# Patient Record
Sex: Male | Born: 1960
Health system: Southern US, Community
[De-identification: ages and names within clinical notes are randomized; demographics above are authoritative.]

## PROBLEM LIST (undated history)

## (undated) DIAGNOSIS — M1990 Unspecified osteoarthritis, unspecified site: Secondary | ICD-10-CM

## (undated) DIAGNOSIS — R7303 Prediabetes: Secondary | ICD-10-CM

## (undated) DIAGNOSIS — I471 Supraventricular tachycardia, unspecified: Secondary | ICD-10-CM

## (undated) DIAGNOSIS — B269 Mumps without complication: Secondary | ICD-10-CM

## (undated) DIAGNOSIS — R002 Palpitations: Secondary | ICD-10-CM

## (undated) DIAGNOSIS — I10 Essential (primary) hypertension: Secondary | ICD-10-CM

## (undated) DIAGNOSIS — E291 Testicular hypofunction: Secondary | ICD-10-CM

## (undated) DIAGNOSIS — K602 Anal fissure, unspecified: Secondary | ICD-10-CM

## (undated) DIAGNOSIS — F5104 Psychophysiologic insomnia: Secondary | ICD-10-CM

## (undated) DIAGNOSIS — N400 Enlarged prostate without lower urinary tract symptoms: Secondary | ICD-10-CM

## (undated) DIAGNOSIS — B059 Measles without complication: Secondary | ICD-10-CM

## (undated) DIAGNOSIS — N529 Male erectile dysfunction, unspecified: Secondary | ICD-10-CM

## (undated) DIAGNOSIS — E785 Hyperlipidemia, unspecified: Secondary | ICD-10-CM

## (undated) DIAGNOSIS — J329 Chronic sinusitis, unspecified: Secondary | ICD-10-CM

## (undated) HISTORY — DX: Supraventricular tachycardia: I47.1

## (undated) HISTORY — DX: Mumps without complication: B26.9

## (undated) HISTORY — DX: Palpitations: R00.2

## (undated) HISTORY — DX: Measles without complication: B05.9

## (undated) HISTORY — DX: Essential (primary) hypertension: I10

## (undated) HISTORY — DX: Supraventricular tachycardia, unspecified: I47.10

## (undated) HISTORY — DX: Psychophysiologic insomnia: F51.04

## (undated) HISTORY — DX: Testicular hypofunction: E29.1

## (undated) HISTORY — DX: Hyperlipidemia, unspecified: E78.5

## (undated) HISTORY — DX: Male erectile dysfunction, unspecified: N52.9

## (undated) HISTORY — DX: Anal fissure, unspecified: K60.2

## (undated) HISTORY — DX: Benign prostatic hyperplasia without lower urinary tract symptoms: N40.0

## (undated) HISTORY — DX: Chronic sinusitis, unspecified: J32.9

---

## 1988-01-06 HISTORY — PX: HERNIA REPAIR: SHX51

## 1990-01-05 HISTORY — PX: VASECTOMY: SHX75

## 2003-12-03 ENCOUNTER — Other Ambulatory Visit: Payer: Self-pay

## 2003-12-03 ENCOUNTER — Emergency Department: Payer: Self-pay | Admitting: Internal Medicine

## 2005-05-05 ENCOUNTER — Emergency Department: Payer: Self-pay | Admitting: Emergency Medicine

## 2005-05-05 ENCOUNTER — Other Ambulatory Visit: Payer: Self-pay

## 2005-05-14 ENCOUNTER — Ambulatory Visit: Payer: Self-pay | Admitting: Internal Medicine

## 2005-06-05 ENCOUNTER — Ambulatory Visit: Payer: Self-pay | Admitting: Internal Medicine

## 2005-06-12 ENCOUNTER — Observation Stay (HOSPITAL_COMMUNITY): Admission: RE | Admit: 2005-06-12 | Discharge: 2005-06-13 | Payer: Self-pay | Admitting: Internal Medicine

## 2005-06-12 ENCOUNTER — Ambulatory Visit: Payer: Self-pay | Admitting: Internal Medicine

## 2005-07-09 ENCOUNTER — Ambulatory Visit: Payer: Self-pay | Admitting: Internal Medicine

## 2006-04-07 ENCOUNTER — Ambulatory Visit: Payer: Self-pay | Admitting: Gastroenterology

## 2007-07-18 ENCOUNTER — Ambulatory Visit: Payer: Self-pay | Admitting: Internal Medicine

## 2008-01-06 HISTORY — PX: OTHER SURGICAL HISTORY: SHX169

## 2008-06-26 DIAGNOSIS — I471 Supraventricular tachycardia, unspecified: Secondary | ICD-10-CM | POA: Insufficient documentation

## 2008-06-26 DIAGNOSIS — R42 Dizziness and giddiness: Secondary | ICD-10-CM | POA: Insufficient documentation

## 2008-07-05 ENCOUNTER — Ambulatory Visit: Payer: Self-pay | Admitting: Internal Medicine

## 2009-08-26 ENCOUNTER — Ambulatory Visit: Payer: Self-pay | Admitting: Gastroenterology

## 2010-05-20 NOTE — Assessment & Plan Note (Signed)
[facility]                         ELECTROPHYSIOLOGY OFFICE NOTE   NAME:[name], [name]                           MRN:          [id number]  DATE:07/18/2007                            DOB:          May 04, 1960    [name] returns today for followup.  He is a very pleasant middle-aged  male with a history of SVT and concealed WPW syndrome who had a  mid/anteroseptal accessory pathway for which she underwent catheter  ablation back in June 2007.  At that time, catheter manipulation  resulted in rendering the accessory pathway nonfunctional.  Ablation was  subsequently delivered to multiple sites very carefully in the mid  septal space.  Of note, the patient care was made secondary concerns  about resulting in complete heart block which, in fact, did not  accomplish.  The patient did well with very little in the way of  symptoms for approximately 2 years.  Several weeks ago, the patient had  three episodes of tachy palpitations to which he was able to self  terminate, the other however, which persisted and resulted in medical  attention.  He was subsequently found by his report to be in SVT at 220  beats per minute and was treated with intravenous adenosine restoring  sinus rhythm.  At that point, he was begun on beta-blocker therapy.  The  patient has had no recurrent symptoms since starting beta-blockers.  He  had no specific complaints otherwise today.   MEDICATIONS:  1. Ambien p.r.n.  2. AndroGel.  3. Metoprolol succinate 25 mg daily.   PHYSICAL EXAMINATION:  GENERAL:  He is a pleasant well-appearing middle-  aged man in no distress.  VITAL SIGNS:  Blood pressure was 130/93, the pulse 90 and regular, and  respirations were 18.  Weight was 202 pounds.  NECK:  No jugular venous distention.  LUNGS:  Clear bilaterally to auscultation.  No wheezes, rales, or  rhonchi are present.  CARDIOVASCULAR:  Regular rate and rhythm with normal S1 and S2.   ABDOMEN:  Soft, nontender, and nondistended.  There is no organomegaly.  EXTREMITIES:  No cyanosis, clubbing, or edema.  The pulses are 2+ and  symmetric.   IMPRESSION:  1. Recurrent supraventricular tachycardia after ablation for which the      accessory pathway was inadvertently bumped at the time of initial      ablation making the tachycardia much more difficult to map.  I      discussed treatment options with [name].  I have recommended that      he continue on with his beta-blockers for now.  If his      supraventricular tachycardia returns, then consideration for redo      ablation would certainly be in order though there is a risk of      heart block.  Based on the location of his accessory pathway.  I      will see the patient back in the      office in several months.  Doylene Christopher Rose. Ladona Ridgel, MD  Electronically Signed  GWT/MedQ  DD: 07/18/2007  DT: 07/19/2007  Job #: [id number]   cc:   Julieanne Manson

## 2010-05-23 NOTE — Op Note (Signed)
NAMECLAYBURN, Christopher Rose                  ACCOUNT NO.:  [id number]   MEDICAL RECORD NO.:  [id number]          PATIENT TYPE:  INP   LOCATION:  2807                         FACILITY:  [facility]   PHYSICIAN:  Doylene Canning. Ladona Ridgel, M.D.  DATE OF BIRTH:  07-26-1960   DATE OF PROCEDURE:  06/12/2005  DATE OF DISCHARGE:                                 OPERATIVE REPORT   PROCEDURE PERFORMED:  Electrophysiology study and catheter ablation of a  concealed mid septal accessory pathway.   INTRODUCTION:  The patient is a very pleasant middle-aged male with a  history of recurrent tachy palpitations and documented SVT with heart rates  of over 220 beats terminated with adenosine in the past.  The patient's  spells start and stop suddenly and he has been refractory to medical therapy  and is now referred for electrophysiologic study and catheter ablation.   PROCEDURE:  After informed consent was obtained, the patient was taken to  the diagnostic EP lab in a fasting state.  After the usual preparation and  draping, intravenous fentanyl and midazolam were given for sedation.  A 6-  [place] hexapolar catheter was inserted percutaneously in the right jugular  vein and advanced to the coronary sinus.  A 5-French quadripolar catheter  was inserted percutaneously in the right femoral vein and advanced to the RV  apex.  A 5-French quadripolar catheter was inserted percutaneously in the  right femoral vein and advanced to the His bundle region.  After measuring  the basic intervals, rapid ventricular pacing was carried out from the RV  apex and stepwise decreased down to 280 milliseconds where VA Wenckebach was  observed.  During rapid ventricular pacing, the atrial activation appeared  to be midline, although there was a question of whether the CS proximal  activation was earlier than the His activation.  There is no inducible  tachycardia.  The atrial activation did decrement but only at the very end  of pacing.  Next,  programmed ventricular stimulation was carried out from  the RV apex at base drive cycle length of 403 milliseconds and the S1-S2  interval was stepwise decreased down to 220 milliseconds where the  ventricular refractoriness was observed.  During programmed ventricular  stimulation, the atrial activation was again minimally decremental and the  activation appeared be earliest in the septum, although the His A and the CS  proximal catheter were fairly close to being on time.  Next, programmed  atrial stimulation was carried out from the coronary sinus at a base drive  cycle length of 474 milliseconds and the S1-S2 interval was stepwise  decreased down to 270 milliseconds where the AV node ERP was observed.  During probing atrial stimulation, there were AH jumps, echo beats, double  echo beats, and nonsustained SVT.  Then, rapid atrial pacing was carried out  from the high right atrium as well as the coronary sinus at a base drive  cycle length of 259 milliseconds and stepwise decreased down to 290  milliseconds where AV Wenckebach was observed.  During rapid atrial pacing,  the PR interval was  equal to the RR interval and at times greater than the  RR interval and there was nonsustained SVT.  It should be noted that the [place]  time was slightly increased from would what normally be expected for AV node  reentry and the patient had inducible tachycardia throughout the atrial  pacing which was nonsustained.  Next, Isoproterenol was infused at a rate of  1 mcg per minute and additional rapid atrial pacing, programmed atrial  stimulation, rapid ventricular pacing and programmed ventricular stimulation  was carried out.  During rapid atrial pacing, there was inducible SVT at a  rate of 250 beats per minute.  This was characterized by a short VA time  although the [place] time was longer than we would normally expect with AV node  reentry.  PVCs were placed at the time of His bundle refractoriness and  this  demonstrated atrial pre-excitation which was quite minimal but still present  typically the atrial being pre-excited approximately 5-8 milliseconds with  PVCs.  With all the above, the diagnosis of a septal accessory pathway  (concealed) was demonstrated and mapping was carried out.  Initially,  mapping was carried on the posteroseptal space near the CS os.  However,  there were no early times in this location.  Additional mapping was carried  out in the mid septal space where there was progressively earlier atrial  activation during tachycardia.  Finally, mapping was carried out in the  anterior septal space and in the area above the His bundle region, there was  a later [place] time suggesting that this was not a true anteroseptal accessory  pathway.  Both the ventricular and the atrial insertion of the pathway was  mapped but it should be noted that mapping was difficult as this was a  retrograde only pathway.  Mapping was also made more difficult by the fact  the tachycardia was quite rapid and associated with some hemodynamic  difficulties for the patient resulting in such that we had to terminate the  tachycardia frequently.  A total of 13 RF energy applications were  delivered.  During the left RF energy application, mapping was carried out  just prior to this.  Prior to 11th RF energy application, tachycardia was  easily inducible.  Following the 11th RF energy application and following  two bonus RF energy applications, the patient was observed for approximately  40 minutes and had no recurrent inducible SVT.  It should be noted, however,  that during the mapping portion of the procedure we could not exclude that  we had inadvertently bumped the tachycardia and not permanently destroyed  it.  However, after 40 minutes there was no inducible tachycardia and as  such, the catheters were removed, hemostasis was assured, and the patient was returned to his room in satisfactory  condition.   COMPLICATIONS:  There were no immediate procedure complications.   RESULTS:  A.  Baseline ECG.  The baseline ECG demonstrates normal sinus  rhythm with normal axis intervals.  B.  The baseline intervals the sinus node cycle length was 830 milliseconds,  the PR interval 128 milliseconds, the HV interval 49 milliseconds, QRS  duration was 84 milliseconds.  C.  Rapid ventricular pacing.  Rapid ventricular pacing was carried out from  the RV apex and stepwise decreased down to 280 milliseconds where VA  Wenckebach was observed.  During rapid ventricular pacing, the atrial  activation was minimally decremental until a pacing cycle length of  approximately 300 milliseconds.  D.  Programmed ventricular stimulation.  Programmed ventricular stimulation  was carried out from the RV apex at base drive cycle length of 161  milliseconds.  The S1-S2 interval was stepwise decreased down to 220  milliseconds where the ventricular refractoriness was observed.  During  programmed ventricular stimulation, the atrial activation was essentially  midline but very minimally decremental until an S1-S2 coupling interval of  approximately 500 to 80.  E.  Rapid atrial pacing.  Rapid atrial pacing was carried out from the  coronary sinus and the high right atrium at a base drive cycle length of 096  milliseconds and stepwise decreased down to 290 milliseconds where AV  Wenckebach was observed.  During rapid atrial pacing, the PR interval was  equal to the RR interval and there was inducible nonsustained SVT.  Following Isoproterenol, rapid atrial pacing resulted in inducible SVT.  F.  Programmed atrial stimulation.  Programmed atrial stimulation was  carried out from the high right atrium and the coronary sinus at a base  drive cycle length of 045 milliseconds.  The S1-S2 interval was stepwise  decreased down to 270 milliseconds where the AV node ERP was observed.  During programmed atrial  stimulation, there were multiple echo beats and  inducible SVT.  G.  Arrhythmias observed:  1.  AV reentry tachycardia.  Initiation rapid atrial pacing, probing      stimulation, on isoproterenol, duration was sustained, termination was      with rapid ventricular pacing.      1.  Mapping.  Mapping of the tachycardia demonstrated earliest atrial          activation in the mid septal space very close to the AV node.          1.  RF energy application.  A total of 13 RF energy applications              were delivered first to the posteroseptal space and then to the              anteroseptal space rendering the tachycardia not inducible.  We              could not definitively exclude that we had not just bumped the              pathway as we could not do ablation during tachycardia for              concerns of injuring the AV node.   CONCLUSION:  This study demonstrates apparent successful EP study and RF catheter ablation of a concealed mid septal accessory pathway originating  very close to the AV node region.  13 RF energy applications were delivered  and following this there was no inducible SVT.           ______________________________  Doylene Canning. Ladona Ridgel, M.D.     GWT/MEDQ  D:  06/12/2005  T:  06/12/2005  Job:  [id number]   cc:   Julieanne Manson  Fax: [phone]

## 2010-05-23 NOTE — Discharge Summary (Signed)
Christopher Rose, Christopher Rose                  ACCOUNT NO.:  [id number]   MEDICAL RECORD NO.:  [id number]          PATIENT TYPE:  INP   LOCATION:  6525                         FACILITY:  [facility]   PHYSICIAN:  Doylene Canning. Ladona Ridgel, M.D.  DATE OF BIRTH:  [birth year]   DATE OF ADMISSION:  06/12/2005  DATE OF DISCHARGE:  06/13/2005                                 DISCHARGE SUMMARY   ALLERGIES:  AMOXICILLIN.   PRINCIPAL DIAGNOSIS:  1.  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  2.  Symptomatic tachyarrhythmias.      1.  Dizzy/lightheaded.      2.  Recurrent episodes despite medical therapy (calcium channel          blockers).      3.  Prolonged episodes requiring adenosine challenge to convert to sinus          rhythm.   SECONDARY DIAGNOSIS:  No significant medical history.   PROCEDURE:  June 12, 2005, electrophysiology/radiofrequency catheter ablation  of inducible atrioventricular reentry tachycardia.  The patient had a  concealed accessory pathway.  Doylene Canning. Ladona Ridgel, M.D., practitioner.  The  patient has had no postprocedural complications.  The catheterization sites  are healing nicely without hematoma.  The patient is alert and oriented.  He  is in sinus rhythm.  He is asked not to drive for the next 2 days, not to  lift anything heavier than 10 pounds for the next 2 weeks.  He may shower.   DISCHARGE MEDICATIONS:  1.  Cardizem LA 360 mg daily.  2.  Ambien 10 mg daily.  3.  Enteric-coated aspirin 81 mg daily for at least 6 weeks.  4.  Note that if he has dental work, even just teeth cleaning, through      November 2007, he is to call [facility] at [phone] for      antibiotic coverage.   He has a follow-up with Dr. Ladona Ridgel at [facility], [address] Thursday, July 5, at 10:15.   BRIEF HISTORY:   [name] is a 50 year old male.  He has a history of  recurrent supraventricular tachycardias.  He has had these for several  years.  They are increasing in frequency and severity.  He has been on  calcium channel blockers but continues to have recurrent episodes.  With  these episodes he will get dizzy and lightheaded.  He does not have frank  syncope.  They are prolonged and often require emergency room challenge with  adenosine to restore sinus rhythm.  He does have mild chest pressure when he  is tachycardic.  He was referred to Dr. Lewayne Bunting, who saw him on May 10,  and the risks, benefits, goals and expectations of electrophysiology study  with catheter ablation have been described to the patient, who wishes to  schedule this electively.   HOSPITAL COURSE:  The patient  presented electively June 8.  He  had  inducible AV reentry tachycardia by way of a concealed accessory pathway.  A  radiofrequency catheter ablation was delivered.  The pathway is close to the  AV node.  The patient had an apparent successful ablation.  He discharges  with his medications and follow-up as noted.      Maple Mirza, P.A.    ______________________________  Doylene Canning. Ladona Ridgel, M.D.    GM/MEDQ  D:  06/12/2005  T:  06/13/2005  Job:  [id number]   cc:   Julieanne Manson  Fax: [phone]

## 2012-08-14 ENCOUNTER — Emergency Department: Payer: Self-pay | Admitting: Unknown Physician Specialty

## 2014-07-17 ENCOUNTER — Other Ambulatory Visit: Payer: BLUE CROSS/BLUE SHIELD

## 2014-07-17 DIAGNOSIS — E291 Testicular hypofunction: Secondary | ICD-10-CM

## 2014-07-18 ENCOUNTER — Encounter: Payer: Self-pay | Admitting: *Deleted

## 2014-07-18 LAB — TESTOSTERONE: Testosterone: 337 ng/dL — ABNORMAL LOW (ref 348–1197)

## 2014-07-24 ENCOUNTER — Other Ambulatory Visit: Payer: Self-pay | Admitting: *Deleted

## 2014-07-24 ENCOUNTER — Encounter: Payer: Self-pay | Admitting: Urology

## 2014-07-24 ENCOUNTER — Ambulatory Visit (INDEPENDENT_AMBULATORY_CARE_PROVIDER_SITE_OTHER): Payer: BLUE CROSS/BLUE SHIELD | Admitting: Urology

## 2014-07-24 VITALS — BP 134/83 | HR 76 | Ht 69.0 in | Wt 194.6 lb

## 2014-07-24 DIAGNOSIS — E291 Testicular hypofunction: Secondary | ICD-10-CM

## 2014-07-24 MED ORDER — TESTOSTERONE 75 MG IL PLLT
75.0000 mg | PELLET | Freq: Once | Status: AC
  Administered 2014-07-24: 75 mg

## 2014-07-24 NOTE — Progress Notes (Signed)
This is a 54 -year-old male with hypogonadism and he is managed with Testopel. He presents today for Testopel insertion.  Patient is placed on the exam table in the left lateral jackknife position.  Identified upper outer quadrant of hip for insertion; prepped area with Betadine and injected 10 cc's of Lidocaine 1% with Epinephrine to anesthetize superficially and distally along trocar tract.  Made 3 mm incision using 15 blade of scalpel; trocar with sharp ended stylet was inserted into subcutaneous tissue in line with femur. Sharp stylet was withdrawn and 6 pellets were placed into trocar well. Testopel pellets advanced into tissue using blunt ended stylet. Trocar removed and incision closed using 3 Steri-Strips. Cleansed area to remove Betadine and covered Steri-Strips with outer Band-Aid.  Careful inspection of insertion is done and patient informed of post procedure instructions.  He will return in three month for serum testosterone before 9:00am, HCT, PSA and DRE.

## 2014-08-30 ENCOUNTER — Other Ambulatory Visit: Payer: Self-pay | Admitting: Family Medicine

## 2014-09-09 ENCOUNTER — Other Ambulatory Visit: Payer: Self-pay | Admitting: Family Medicine

## 2014-09-20 ENCOUNTER — Ambulatory Visit (INDEPENDENT_AMBULATORY_CARE_PROVIDER_SITE_OTHER): Payer: BLUE CROSS/BLUE SHIELD | Admitting: Family Medicine

## 2014-09-20 ENCOUNTER — Encounter: Payer: Self-pay | Admitting: Family Medicine

## 2014-09-20 VITALS — BP 138/72 | HR 60 | Temp 98.0°F | Resp 12 | Ht 69.0 in | Wt 194.0 lb

## 2014-09-20 DIAGNOSIS — N528 Other male erectile dysfunction: Secondary | ICD-10-CM | POA: Diagnosis not present

## 2014-09-20 DIAGNOSIS — Z0000 Encounter for general adult medical examination without abnormal findings: Secondary | ICD-10-CM | POA: Diagnosis not present

## 2014-09-20 DIAGNOSIS — E291 Testicular hypofunction: Secondary | ICD-10-CM | POA: Insufficient documentation

## 2014-09-20 DIAGNOSIS — I10 Essential (primary) hypertension: Secondary | ICD-10-CM | POA: Insufficient documentation

## 2014-09-20 DIAGNOSIS — Z87898 Personal history of other specified conditions: Secondary | ICD-10-CM | POA: Insufficient documentation

## 2014-09-20 DIAGNOSIS — G4700 Insomnia, unspecified: Secondary | ICD-10-CM | POA: Insufficient documentation

## 2014-09-20 DIAGNOSIS — N529 Male erectile dysfunction, unspecified: Secondary | ICD-10-CM | POA: Insufficient documentation

## 2014-09-20 DIAGNOSIS — K648 Other hemorrhoids: Secondary | ICD-10-CM | POA: Insufficient documentation

## 2014-09-20 DIAGNOSIS — E7800 Pure hypercholesterolemia, unspecified: Secondary | ICD-10-CM | POA: Insufficient documentation

## 2014-09-20 LAB — POCT URINALYSIS DIPSTICK
BILIRUBIN UA: NEGATIVE
Blood, UA: NEGATIVE
GLUCOSE UA: NEGATIVE
KETONES UA: NEGATIVE
LEUKOCYTES UA: NEGATIVE
Nitrite, UA: NEGATIVE
Protein, UA: NEGATIVE
Spec Grav, UA: 1.025
Urobilinogen, UA: NEGATIVE
pH, UA: 6.5

## 2014-09-20 MED ORDER — SILDENAFIL CITRATE 20 MG PO TABS: ORAL_TABLET | ORAL | Status: DC

## 2014-09-20 NOTE — Progress Notes (Signed)
Patient ID: Christopher Rose, male   DOB: 04/01/1960, 53 y.o.   MRN: [id number]  Visit Date: 09/20/2014  Today's Provider: Wilhemena Durie, MD   Chief Complaint  Patient presents with  . Annual Exam   Subjective:  Christopher Rose is a 54 y.o. male who presents today for health maintenance and complete physical. He feels well. He reports he is sleeping well.  LAST: Colonoscopy 2014 per patient with Dr. Dionne Milo but we do not have results. Will get those records.  Tdap 08/11/12  EKG 08/11/12.  Review of Systems  Constitutional: Positive for fatigue.  HENT: Negative.   Eyes: Negative.   Respiratory: Negative.   Cardiovascular: Negative.   Gastrointestinal: Negative.   Endocrine: Negative.   Genitourinary: Negative.   Musculoskeletal: Negative.   Skin: Negative.   Allergic/Immunologic: Negative.   Neurological: Negative.   Hematological: Negative.   Psychiatric/Behavioral: Negative.     Social History   Social History  . Marital Status: Married    Spouse Name: N/A  . Number of Children: N/A  . Years of Education: N/A   Occupational History  . Not on file.   Social History Main Topics  . Smoking status: Never Smoker   . Smokeless tobacco: Never Used  . Alcohol Use: 0.0 oz/week    0 Standard drinks or equivalent per week     Comment: occasional  . Drug Use: No  . Sexual Activity: Yes   Other Topics Concern  . Not on file   Social History Narrative    Patient Active Problem List   Diagnosis Date Noted  . Insomnia, persistent 09/20/2014  . ED (erectile dysfunction) of organic origin 09/20/2014  . History of prolonged Q-T interval on ECG 09/20/2014  . Internal hemorrhoids 09/20/2014  . Hypercholesteremia 09/20/2014  . BP (high blood pressure) 09/20/2014  . 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 SUPRAVENTRICULAR TACHYCARDIA 06/26/2008  . DIZZINESS 06/26/2008    Past Surgical History  Procedure Laterality Date  . Cardiac ablation surgery  2010  . Vasectomy  1992  . Hernia  repair  1990    His family history includes Colon cancer in his paternal grandfather; Diabetes Mellitus II in his mother; Heart attack in his father. There is no history of Kidney disease or Prostate cancer.    Outpatient Prescriptions Prior to Visit  Medication Sig Dispense Refill  . meloxicam (MOBIC) 15 MG tablet take 1 tablet by mouth once daily 30 tablet 12  . metoprolol succinate (TOPROL-XL) 25 MG 24 hr tablet take 1 tablet by mouth once daily 30 tablet 12  . tadalafil (CIALIS) 20 MG tablet Take 20 mg by mouth daily as needed for erectile dysfunction.    . Testosterone (TESTOPEL) 75 MG PLLT by Implant route every 3 (three) months.    . zolpidem (AMBIEN) 10 MG tablet take 1 tablet by mouth at bedtime if needed 30 tablet 5   No facility-administered medications prior to visit.    Patient Care Team: Jerrol Banana., MD as PCP - General (Family Medicine)     Objective:   Vitals:  Filed Vitals:   09/20/14 0914  BP: 138/72  Pulse: 60  Temp: 98 F (36.7 C)  Resp: 12  Height: 5\' 9"  (1.753 m)  Weight: 194 lb (87.998 kg)    Physical Exam  Constitutional: He is oriented to person, place, and time. He appears well-developed and well-nourished.  HENT:  Head: Normocephalic.  Eyes: Conjunctivae are normal. Pupils are equal, round, and reactive to light.  Neck: Normal range of motion. Neck supple.  Cardiovascular: Normal rate, regular rhythm, normal heart sounds and intact distal pulses.   No murmur heard. Pulmonary/Chest: Effort normal and breath sounds normal. No respiratory distress. He has no wheezes.  Abdominal: Soft. Bowel sounds are normal. There is no tenderness. There is no rebound.  Genitourinary: Rectum normal, prostate normal and penis normal.  Musculoskeletal: Normal range of motion. He exhibits no edema or tenderness.  Neurological: He is alert and oriented to person, place, and time.  Skin: Skin is warm and dry. No erythema.  Psychiatric: He has a normal mood  and affect. His behavior is normal. Judgment and thought content normal.     Depression Screen PHQ 2/9 Scores 09/20/2014  PHQ - 2 Score 0      Assessment & Plan:   1. Annual physical exam PSA is updated with urologist. - CBC with Differential/Platelet - Comprehensive Metabolic Panel (CMET) - TSH - Lipid Panel With LDL/HDL Ratio - POCT urinalysis dipstick Discussed a regular exercise program. Overall health is good. Patient may be retiring in the next year. 2. Other male erectile dysfunction Cialis is not been covered. Will try Sildanefil. Follow as needed       Patient was seen and examined by Dr. Eulas Post and note was scribed by Theressa Millard, RMA.

## 2014-09-21 LAB — COMPREHENSIVE METABOLIC PANEL
ALBUMIN: 4.2 g/dL (ref 3.5–5.5)
ALT: 26 IU/L (ref 0–44)
AST: 20 IU/L (ref 0–40)
Albumin/Globulin Ratio: 1.9 (ref 1.1–2.5)
Alkaline Phosphatase: 97 IU/L (ref 39–117)
BILIRUBIN TOTAL: 0.9 mg/dL (ref 0.0–1.2)
BUN/Creatinine Ratio: 15 (ref 9–20)
BUN: 16 mg/dL (ref 6–24)
CALCIUM: 9.4 mg/dL (ref 8.7–10.2)
CHLORIDE: 100 mmol/L (ref 97–108)
CO2: 24 mmol/L (ref 18–29)
CREATININE: 1.04 mg/dL (ref 0.76–1.27)
GFR, EST AFRICAN AMERICAN: 93 mL/min/{1.73_m2} (ref >59)
GFR, EST NON AFRICAN AMERICAN: 80 mL/min/{1.73_m2} (ref >59)
GLUCOSE: 111 mg/dL — AB (ref 65–99)
Globulin, Total: 2.2 g/dL (ref 1.5–4.5)
Potassium: 4.7 mmol/L (ref 3.5–5.2)
Sodium: 140 mmol/L (ref 134–144)
Total Protein: 6.4 g/dL (ref 6.0–8.5)

## 2014-09-21 LAB — LIPID PANEL WITH LDL/HDL RATIO
Cholesterol, Total: 187 mg/dL (ref 100–199)
HDL: 46 mg/dL (ref >39)
LDL Calculated: 108 mg/dL — ABNORMAL HIGH (ref 0–99)
LDL/HDL RATIO: 2.3 ratio (ref 0.0–3.6)
Triglycerides: 164 mg/dL — ABNORMAL HIGH (ref 0–149)
VLDL CHOLESTEROL CAL: 33 mg/dL (ref 5–40)

## 2014-09-21 LAB — CBC WITH DIFFERENTIAL/PLATELET
BASOS: 0 %
Basophils Absolute: 0 10*3/uL (ref 0.0–0.2)
EOS (ABSOLUTE): 0.1 10*3/uL (ref 0.0–0.4)
Eos: 2 %
HEMOGLOBIN: 15.6 g/dL (ref 12.6–17.7)
Hematocrit: 45.2 % (ref 37.5–51.0)
IMMATURE GRANS (ABS): 0 10*3/uL (ref 0.0–0.1)
Immature Granulocytes: 0 %
LYMPHS: 21 %
Lymphocytes Absolute: 1.5 10*3/uL (ref 0.7–3.1)
MCH: 30.7 pg (ref 26.6–33.0)
MCHC: 34.5 g/dL (ref 31.5–35.7)
MCV: 89 fL (ref 79–97)
Monocytes Absolute: 0.6 10*3/uL (ref 0.1–0.9)
Monocytes: 8 %
NEUTROS ABS: 5.1 10*3/uL (ref 1.4–7.0)
Neutrophils: 69 %
Platelets: 225 10*3/uL (ref 150–379)
RBC: 5.08 x10E6/uL (ref 4.14–5.80)
RDW: 14.7 % (ref 12.3–15.4)
WBC: 7.4 10*3/uL (ref 3.4–10.8)

## 2014-09-21 LAB — TSH: TSH: 1.27 u[IU]/mL (ref 0.450–4.500)

## 2014-10-10 ENCOUNTER — Other Ambulatory Visit: Payer: BLUE CROSS/BLUE SHIELD

## 2014-10-18 ENCOUNTER — Other Ambulatory Visit: Payer: BLUE CROSS/BLUE SHIELD

## 2014-10-24 ENCOUNTER — Ambulatory Visit: Payer: BLUE CROSS/BLUE SHIELD | Admitting: Urology

## 2014-10-25 ENCOUNTER — Other Ambulatory Visit: Payer: BLUE CROSS/BLUE SHIELD

## 2014-11-02 ENCOUNTER — Telehealth: Payer: Self-pay | Admitting: *Deleted

## 2014-11-02 NOTE — Telephone Encounter (Signed)
Patient returned call. Patient states he is coming for procedure and I let him know he needs to fill out another Testopel form for approval. Patient to come in on Monday to pick up form and fill it out. Patient states he will fill it out before leaving office. Form put up front with [employer] for patient pickup.

## 2014-11-02 NOTE — Telephone Encounter (Signed)
[facility] for patient to call me back about his appointment on November 7th with [facility]. Is it just a follow up appointment or does he think he is going to get a Testopel implant that day. If patient plans to have the procedure he will need to fill out new paperwork and get an approval before this appointment.

## 2014-11-06 ENCOUNTER — Other Ambulatory Visit: Payer: BLUE CROSS/BLUE SHIELD

## 2014-11-06 DIAGNOSIS — E291 Testicular hypofunction: Secondary | ICD-10-CM

## 2014-11-07 LAB — HEMATOCRIT: HEMATOCRIT: 44.4 % (ref 37.5–51.0)

## 2014-11-07 LAB — PSA: PROSTATE SPECIFIC AG, SERUM: 1.1 ng/mL (ref 0.0–4.0)

## 2014-11-07 LAB — TESTOSTERONE: TESTOSTERONE: 299 ng/dL — AB (ref 348–1197)

## 2014-11-12 ENCOUNTER — Encounter: Payer: Self-pay | Admitting: Urology

## 2014-11-12 ENCOUNTER — Ambulatory Visit (INDEPENDENT_AMBULATORY_CARE_PROVIDER_SITE_OTHER): Payer: BLUE CROSS/BLUE SHIELD | Admitting: Urology

## 2014-11-12 VITALS — BP 135/83 | HR 89 | Ht 69.0 in | Wt 192.9 lb

## 2014-11-12 DIAGNOSIS — E291 Testicular hypofunction: Secondary | ICD-10-CM | POA: Insufficient documentation

## 2014-11-12 MED ORDER — TESTOSTERONE 75 MG IL PLLT
75.0000 mg | PELLET | Freq: Once | Status: AC
  Administered 2014-11-12: 75 mg

## 2014-11-12 NOTE — Progress Notes (Signed)
This is a 54 -year-old male with hypogonadism and he is managed with Testopel. He presents today for Testopel insertion.  Patient is placed on the exam table in the right lateral jackknife position.  Identified upper outer quadrant of hip for insertion; prepped area with Betadine and injected 10 cc's of Lidocaine 1% with Epinephrine to anesthetize superficially and distally along trocar tract.  Made 3 mm incision using 15 blade of scalpel; trocar with sharp ended stylet was inserted into subcutaneous tissue in line with femur. Sharp stylet was withdrawn and 6 pellets were placed into trocar well. Testopel pellets advanced into tissue using blunt ended stylet. Trocar removed and incision closed using 6 Steri-Strips. Cleansed area to remove Betadine and covered Steri-Strips with outer Band-Aid.  Careful inspection of insertion is done and patient informed of post procedure instructions.  He will return in three month for serum testosterone and HCT before 9:00am.  GU: No CVA tenderness.  No bladder fullness or masses.  Patient with circumcised phallus.   Urethral meatus is patent.  No penile discharge. No penile lesions or rashes. Scrotum without lesions, cysts, rashes and/or edema.  Testicles are located scrotally bilaterally. No masses are appreciated in the testicles. Left and right epididymis are normal. Rectal: Patient with  normal sphincter tone. Anus and perineum without scarring or rashes. No rectal masses are appreciated. Prostate is approximately 45 grams, no nodules are appreciated. Seminal vesicles are normal.

## 2015-02-11 ENCOUNTER — Telehealth: Payer: Self-pay | Admitting: *Deleted

## 2015-02-11 NOTE — Telephone Encounter (Signed)
Pt is returning your call, please call.

## 2015-02-11 NOTE — Telephone Encounter (Signed)
Spoke with patient about approval and deductible. Patient states that is fine. I also let patient know that he needs an office visit per shannon prior to the next testopel. Put patient on the schedule for this Wednesday at 9:00. Patient ok and will show up at 8:30 for labs.

## 2015-02-11 NOTE — Telephone Encounter (Signed)
LMOM  For patient to call back. I need to talk to patient about his approval and deductible for Testopel.

## 2015-02-13 ENCOUNTER — Other Ambulatory Visit: Payer: BLUE CROSS/BLUE SHIELD

## 2015-02-13 ENCOUNTER — Encounter: Payer: Self-pay | Admitting: Urology

## 2015-02-13 ENCOUNTER — Ambulatory Visit (INDEPENDENT_AMBULATORY_CARE_PROVIDER_SITE_OTHER): Payer: BLUE CROSS/BLUE SHIELD | Admitting: Urology

## 2015-02-13 VITALS — BP 136/92 | HR 79 | Ht 69.0 in | Wt 198.9 lb

## 2015-02-13 DIAGNOSIS — N138 Other obstructive and reflux uropathy: Secondary | ICD-10-CM | POA: Insufficient documentation

## 2015-02-13 DIAGNOSIS — E291 Testicular hypofunction: Secondary | ICD-10-CM

## 2015-02-13 DIAGNOSIS — N528 Other male erectile dysfunction: Secondary | ICD-10-CM | POA: Diagnosis not present

## 2015-02-13 DIAGNOSIS — N529 Male erectile dysfunction, unspecified: Secondary | ICD-10-CM

## 2015-02-13 DIAGNOSIS — N401 Enlarged prostate with lower urinary tract symptoms: Secondary | ICD-10-CM

## 2015-02-13 MED ORDER — CLOMIPHENE CITRATE 50 MG PO TABS: ORAL_TABLET | ORAL | Status: DC

## 2015-02-13 NOTE — Progress Notes (Signed)
02/13/2015 9:28 AM   Christopher Rose March 12, 1960 [id number]  Referring provider: Jerrol Banana., MD [address] [place], [place] [address]  Chief Complaint  Patient presents with  . Hypogonadism    follow up    HPI: Patient is a 55 year old Caucasian male with hypogonadism, erectile dysfunction and BPH with LUTS who presents today for a 6 months follow up.    Hypogonadism Patient is experiencing a decrease in libido, a lack of energy, a decrease in strength, erections being less strong and a recent deterioration in an ability to play sports.  This is indicated by his responses to the ADAM questionnaire.  He is currently managing his hypogonadism with Testopel.  He has been dissatified with the results of the Testopel.  He feels that the effects "wear off" a few weeks before his next insertion is due.  He would like to discuss other options.          Androgen Deficiency in the Aging Male      02/13/15 0900       Androgen Deficiency in the Aging Male   Do you have a decrease in libido (sex drive) Yes     Do you have lack of energy Yes     Do you have a decrease in strength and/or endurance Yes     Have you lost height No     Have you noticed a decreased "enjoyment of life" No     Are you sad and/or grumpy No     Are your erections less strong Yes     Have you noticed a recent deterioration in your ability to play sports Yes     Are you falling asleep after dinner No     Has there been a recent deterioration in your work performance No       Erectile dysfunction His SHIM score is 15, which is mild to moderate ED.   He has been having difficulty with erections for the last few years.   His major complaint is maintaining an erection.  His libido is diminished.   His risk factors for ED are age, BPH, HTN and hypogonadism.  He denies any painful erections or curvatures with his erections.   He has tried Cialis 20 mg, 1/2 tablet in the past with adequate results.          SHIM      02/13/15 0910       SHIM: Over the last 6 months:   How do you rate your confidence that you could get and keep an erection? Low     When you had erections with sexual stimulation, how often were your erections hard enough for penetration (entering your partner)? Sometimes (about half the time)     During sexual intercourse, how often were you able to maintain your erection after you had penetrated (entered) your partner? Difficult     During sexual intercourse, how difficult was it to maintain your erection to completion of intercourse? Slightly Difficult     When you attempted sexual intercourse, how often was it satisfactory for you? Difficult     SHIM Total Score   SHIM 15        Score: 1-7 Severe ED 8-11 Moderate ED 12-16 Mild-Moderate ED 17-21 Mild ED 22-25 No ED   BPH WITH LUTS His IPSS score today is 4, which is mild lower urinary tract symptomatology. He is pleased with his quality life due  to his urinary symptoms.  He denies any dysuria, hematuria or suprapubic pain.  He also denies any recent fevers, chills, nausea or vomiting.  He does not have a family history of PCa.      IPSS      02/13/15 0900       International Prostate Symptom Score   How often have you had the sensation of not emptying your bladder? Not at All     How often have you had to urinate less than every two hours? Less than 1 in 5 times     How often have you found you stopped and started again several times when you urinated? Less than 1 in 5 times     How often have you found it difficult to postpone urination? Not at All     How often have you had a weak urinary stream? Less than 1 in 5 times     How often have you had to strain to start urination? Not at All     How many times did you typically get up at night to urinate? 1 Time     Total IPSS Score 4     Quality of Life due to urinary symptoms   If you were to spend the rest of your life with your urinary condition  just the way it is now how would you feel about that? Pleased        Score:  1-7 Mild 8-19 Moderate 20-35 Severe       PMH: Past Medical History  Diagnosis Date  . Measles   . Mumps   . Anal fissure   . SVT (supraventricular tachycardia) ([facility])   . ED (erectile dysfunction)   . HLD (hyperlipidemia)   . Chronic sinusitis   . Palpitations   . Chronic insomnia   . Hypogonadism in male   . BPH (benign prostatic hyperplasia)   . HTN (hypertension)     Surgical History: Past Surgical History  Procedure Laterality Date  . Cardiac ablation surgery  2010  . Vasectomy  1992  . Hernia repair  1990    Home Medications:    Medication List       This list is accurate as of: 02/13/15  9:28 AM.  Always use your most recent med list.               CIALIS 20 MG tablet  Generic drug:  tadalafil     clomiPHENE 50 MG tablet  Commonly known as:  CLOMID  Take 1/2 tablet daily     meloxicam 15 MG tablet  Commonly known as:  MOBIC  take 1 tablet by mouth once daily     metoprolol succinate 25 MG 24 hr tablet  Commonly known as:  TOPROL-XL  take 1 tablet by mouth once daily     sildenafil 20 MG tablet  Commonly known as:  REVATIO  1 to 5 tablets daily     TESTOPEL 75 MG Pllt  Generic drug:  Testosterone  by Implant route every 3 (three) months.     zolpidem 10 MG tablet  Commonly known as:  AMBIEN  take 1 tablet by mouth at bedtime if needed        Allergies:  Allergies  Allergen Reactions  . Amoxicillin   . Penicillins     Family History: Family History  Problem Relation Age of Onset  . Diabetes Mellitus II Mother   . Colon cancer Paternal Grandfather   .  Heart attack Father   . Kidney disease Neg Hx   . Prostate cancer Neg Hx     Social History:  reports that he has never smoked. He has never used smokeless tobacco. He reports that he drinks alcohol. He reports that he does not use illicit drugs.  ROS: UROLOGY Frequent Urination?: No Hard to  postpone urination?: No Burning/pain with urination?: No Get up at night to urinate?: No Leakage of urine?: No Urine stream starts and stops?: No Trouble starting stream?: No Do you have to strain to urinate?: No Blood in urine?: No Urinary tract infection?: No Sexually transmitted disease?: No Injury to kidneys or bladder?: No Painful intercourse?: No Weak stream?: No Erection problems?: Yes Penile pain?: No  Gastrointestinal Nausea?: No Vomiting?: No Indigestion/heartburn?: No Diarrhea?: No Constipation?: No  Constitutional Fever: No Night sweats?: No Weight loss?: No Fatigue?: No  Skin Skin rash/lesions?: No Itching?: No  Eyes Blurred vision?: No Double vision?: No  Ears/Nose/Throat Sore throat?: No Sinus problems?: No  Hematologic/Lymphatic Swollen glands?: No Easy bruising?: No  Cardiovascular Leg swelling?: No Chest pain?: No  Respiratory Cough?: No Shortness of breath?: No  Endocrine Excessive thirst?: No  Musculoskeletal Back pain?: No Joint pain?: No  Neurological Headaches?: No Dizziness?: No  Psychologic Depression?: No Anxiety?: No  Physical Exam: BP 136/92 mmHg  Pulse 79  Ht 5\' 9"  (1.753 m)  Wt 198 lb 14.4 oz (90.22 kg)  BMI 29.36 kg/m2  Constitutional: Well nourished. Alert and oriented, No acute distress. HEENT: [place] AT, moist mucus membranes. Trachea midline, no masses. Cardiovascular: No clubbing, cyanosis, or edema. Respiratory: Normal respiratory effort, no increased work of breathing. GI: Abdomen is soft, non tender, non distended, no abdominal masses. Liver and spleen not palpable.  No hernias appreciated.  Stool sample for occult testing is not indicated.   GU: No CVA tenderness.  No bladder fullness or masses.  Patient with circumcised phallus.   Urethral meatus is patent.  No penile discharge. No penile lesions or rashes. Scrotum without lesions, cysts, rashes and/or edema.  Testicles are located scrotally  bilaterally. No masses are appreciated in the testicles. Left and right epididymis are normal. Rectal: Patient with  normal sphincter tone. Anus and perineum without scarring or rashes. No rectal masses are appreciated. Prostate is approximately 50 grams, no nodules are appreciated. Seminal vesicles are normal. Skin: No rashes, bruises or suspicious lesions. Lymph: No cervical or inguinal adenopathy. Neurologic: Grossly intact, no focal deficits, moving all 4 extremities. Psychiatric: Normal mood and affect.  Laboratory Data: Results for orders placed or performed in visit on 11/06/14  PSA  Result Value Ref Range   Prostate Specific Ag, Serum 1.1 0.0 - 4.0 ng/mL  Testosterone  Result Value Ref Range   Testosterone 299 (L) 348 - 1197 ng/dL   Comment, Testosterone Comment   Hematocrit  Result Value Ref Range   Hematocrit 44.4 37.5 - 51.0 %   Lab Results  Component Value Date   WBC 7.4 09/20/2014   HCT 44.4 11/06/2014   MCV 89 09/20/2014   PLT 225 09/20/2014    Lab Results  Component Value Date   CREATININE 1.04 09/20/2014    Lab Results  Component Value Date   TSH 1.270 09/20/2014   Lipid Panel     Component Value Date/Time   CHOL 187 09/20/2014 0944   TRIG 164* 09/20/2014 0944   HDL 46 09/20/2014 0944   LDLCALC 108* 09/20/2014 0944    Lab Results  Component Value Date  AST 20 09/20/2014   Lab Results  Component Value Date   ALT 26 09/20/2014     Assessment & Plan:    1. Hypogonadism in male:   Patient had been on gels in the past and now has a young grandchild and does not want to restart the gels due to transferrence issues.   I also discussed that some men have had success using clomid for hypogonadism.  It does seem to be more successful in younger men, but there are incidences of good results in middle-aged men.  I explained that it is used in male infertility to stimulate the testicles to make more testosterone/sperm.  There has been no long term data  on side effects, but some urologists has been having success with this medication.  I have sent the prescription in for Clomid 50 mg, 1/2 tablet daily.  He will return in one month before 9 AM for a serum testosterone draw.   - Estradiol - Hematocrit - Testosterone  2. Erectile dysfunction:   SHIM score is 15.  I have suggested that the patient try a whole tablet of the Cialis, 20 mg, and see if the effectiveness is more satisfying.  He will have his SHIM score and exam repeated in 6 montnhs.    3. BPH with LUTS:   IPSS score is 4/1.  We will continue to monitor.  He will have the IPSS score, exam and PSA repeated in 6 months.     Return in about 1 month (around 03/13/2015) for morning testosterone before 9AM.  These notes generated with voice recognition software. I apologize for typographical errors.  Zara Council, [facility] [address], [place], [place] [address] [phone]

## 2015-02-14 ENCOUNTER — Telehealth: Payer: Self-pay

## 2015-02-14 DIAGNOSIS — E291 Testicular hypofunction: Secondary | ICD-10-CM

## 2015-02-14 LAB — HEMATOCRIT: Hematocrit: 46.5 % (ref 37.5–51.0)

## 2015-02-14 LAB — ESTRADIOL: Estradiol: <5 pg/mL — ABNORMAL LOW (ref 7.6–42.6)

## 2015-02-14 LAB — TESTOSTERONE: Testosterone: 225 ng/dL — ABNORMAL LOW (ref 348–1197)

## 2015-02-14 NOTE — Telephone Encounter (Signed)
LMOM-lab results are as expected. Need another a.m. Testosterone in 33month. Lab orders placed.

## 2015-02-14 NOTE — Telephone Encounter (Signed)
-----   Message from Nori Riis, PA-C sent at 02/14/2015  8:27 AM EST ----- Patient's labs are as expected.  He is coming back in one month for an AM testosterone, before 9AM.

## 2015-02-22 ENCOUNTER — Ambulatory Visit: Payer: BLUE CROSS/BLUE SHIELD | Admitting: Urology

## 2015-03-01 ENCOUNTER — Other Ambulatory Visit: Payer: Self-pay | Admitting: Family Medicine

## 2015-03-12 ENCOUNTER — Other Ambulatory Visit: Payer: BLUE CROSS/BLUE SHIELD

## 2015-03-12 DIAGNOSIS — E291 Testicular hypofunction: Secondary | ICD-10-CM

## 2015-03-13 LAB — TESTOSTERONE: Testosterone: 512 ng/dL (ref 348–1197)

## 2015-03-18 ENCOUNTER — Telehealth: Payer: Self-pay

## 2015-03-18 DIAGNOSIS — E291 Testicular hypofunction: Secondary | ICD-10-CM

## 2015-03-18 NOTE — Telephone Encounter (Signed)
-----   Message from Nori Riis, PA-C sent at 03/17/2015  8:52 PM EDT ----- Patient's testosterone level is great.  Would continue the Clomid and we will see him in 5 months.  He will need a morning testosterone level, hematocrit, hepatic panel and PSA.

## 2015-03-18 NOTE — Telephone Encounter (Signed)
LMOM- lab levels great. Need labs again in 50mo. Orders placed.

## 2015-03-20 ENCOUNTER — Ambulatory Visit: Payer: BLUE CROSS/BLUE SHIELD | Admitting: Family Medicine

## 2015-03-27 ENCOUNTER — Encounter: Payer: Self-pay | Admitting: Family Medicine

## 2015-03-27 ENCOUNTER — Ambulatory Visit (INDEPENDENT_AMBULATORY_CARE_PROVIDER_SITE_OTHER): Payer: BLUE CROSS/BLUE SHIELD | Admitting: Family Medicine

## 2015-03-27 VITALS — BP 148/82 | HR 66 | Temp 98.1°F | Resp 16 | Wt 197.0 lb

## 2015-03-27 DIAGNOSIS — I471 Supraventricular tachycardia: Secondary | ICD-10-CM | POA: Diagnosis not present

## 2015-03-27 DIAGNOSIS — I10 Essential (primary) hypertension: Secondary | ICD-10-CM | POA: Diagnosis not present

## 2015-03-27 DIAGNOSIS — E7800 Pure hypercholesterolemia, unspecified: Secondary | ICD-10-CM

## 2015-03-27 DIAGNOSIS — G4700 Insomnia, unspecified: Secondary | ICD-10-CM

## 2015-03-27 DIAGNOSIS — R739 Hyperglycemia, unspecified: Secondary | ICD-10-CM | POA: Diagnosis not present

## 2015-03-27 DIAGNOSIS — E291 Testicular hypofunction: Secondary | ICD-10-CM

## 2015-03-27 LAB — POCT GLYCOSYLATED HEMOGLOBIN (HGB A1C): Hemoglobin A1C: 5.3

## 2015-03-27 NOTE — Progress Notes (Signed)
Patient ID: Christopher Rose, male   DOB: [birth year], 55 y.o.   MRN: [id number]    Subjective:  HPI Hyperglycemia- Pt is here for a 6 month follow up. He was here for his CPE and had routine labs. It showed his glucose to be high. Will check A1c today.  Insomnia- He is taking Ambien, 1/2 tablet every night and sleeping well.    Hypertension, follow-up:  BP Readings from Last 3 Encounters:  03/27/15 148/82  02/13/15 136/92  11/12/14 135/83    He was last seen for hypertension 6 months ago.  BP at that visit was 136/92. Management since that visit includes none. He reports good compliance with treatment. He is not having side effects.  He is not exercising. He is adherent to low salt diet.   Outside blood pressures are not being check, pt does not think he has HTN. He is experiencing none.  Patient denies chest pain, chest pressure/discomfort, claudication, dyspnea, exertional chest pressure/discomfort, fatigue, irregular heart beat, lower extremity edema, near-syncope, orthopnea and palpitations.    Wt Readings from Last 3 Encounters:  03/27/15 197 lb (89.359 kg)  02/13/15 198 lb 14.4 oz (90.22 kg)  11/12/14 192 lb 14.4 oz (87.499 kg)    ------------------------------------------------------------------------     Prior to Admission medications   Medication Sig Start Date End Date Taking? Authorizing Provider  CIALIS 20 MG tablet take 1 tablet by mouth if needed 03/02/15  Yes Richard Maceo Pro., MD  clomiPHENE (CLOMID) 50 MG tablet Take 1/2 tablet daily 02/13/15  Yes Shannon A McGowan, PA-C  meloxicam (MOBIC) 15 MG tablet take 1 tablet by mouth once daily 09/11/14  Yes Richard Maceo Pro., MD  metoprolol succinate (TOPROL-XL) 25 MG 24 hr tablet take 1 tablet by mouth once daily 09/11/14  Yes Richard Maceo Pro., MD  zolpidem (AMBIEN) 10 MG tablet take 1 tablet by mouth at bedtime if needed 08/31/14  Yes Jerrol Banana., MD  sildenafil (REVATIO) 20 MG tablet 1 to 5 tablets  daily Patient not taking: Reported on 11/12/2014 09/20/14   Jerrol Banana., MD    Patient Active Problem List   Diagnosis Date Noted  . Hyperglycemia 03/27/2015  . Insomnia 03/27/2015  . Erectile dysfunction of organic origin 02/13/2015  . BPH with obstruction/lower urinary tract symptoms 02/13/2015  . Hypogonadism in male 11/12/2014  . Insomnia, persistent 09/20/2014  . ED (erectile dysfunction) of organic origin 09/20/2014  . History of prolonged Q-T interval on ECG 09/20/2014  . Internal hemorrhoids 09/20/2014  . Hypercholesteremia 09/20/2014  . BP (high blood pressure) 09/20/2014  . 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 SVT (supraventricular tachycardia) ([place]) 06/26/2008  . DIZZINESS 06/26/2008    Past Medical History  Diagnosis Date  . Measles   . Mumps   . Anal fissure   . SVT (supraventricular tachycardia) ([place])   . ED (erectile dysfunction)   . HLD (hyperlipidemia)   . Chronic sinusitis   . Palpitations   . Chronic insomnia   . Hypogonadism in male   . BPH (benign prostatic hyperplasia)   . HTN (hypertension)     Social History   Social History  . Marital Status: Married    Spouse Name: N/A  . Number of Children: N/A  . Years of Education: N/A   Occupational History  . Not on file.   Social History Main Topics  . Smoking status: Never Smoker   . Smokeless tobacco: Never Used  . Alcohol Use: 0.0 oz/week  0 Standard drinks or equivalent per week     Comment: occasional  . Drug Use: No  . Sexual Activity: Yes   Other Topics Concern  . Not on file   Social History Narrative    Allergies  Allergen Reactions  . Amoxicillin   . Penicillins     Review of Systems  Constitutional: Negative.   HENT: Negative.   Eyes: Negative.   Respiratory: Negative.   Cardiovascular: Negative.   Gastrointestinal: Negative.   Genitourinary: Negative.   Musculoskeletal: Negative.   Skin: Negative.   Neurological: Negative.   Endo/Heme/Allergies: Negative.     Psychiatric/Behavioral: Negative.      There is no immunization history on file for this patient. Objective:  BP 148/82 mmHg  Pulse 66  Temp(Src) 98.1 F (36.7 C) (Oral)  Resp 16  Wt 197 lb (89.359 kg)  Physical Exam  Constitutional: He is oriented to person, place, and time and well-developed, well-nourished, and in no distress.  Eyes: Conjunctivae and EOM are normal. Pupils are equal, round, and reactive to light.  Neck: Normal range of motion. Neck supple.  Cardiovascular: Normal rate, regular rhythm, normal heart sounds and intact distal pulses.   Pulmonary/Chest: Effort normal and breath sounds normal.  Abdominal: Soft. Bowel sounds are normal.  Musculoskeletal: Normal range of motion.  Neurological: He is alert and oriented to person, place, and time. He has normal reflexes. Gait normal. GCS score is 15.  Skin: Skin is warm and dry.  Psychiatric: Mood, memory, affect and judgment normal.    Lab Results  Component Value Date   WBC 7.4 09/20/2014   HCT 46.5 02/13/2015   PLT 225 09/20/2014   GLUCOSE 111* 09/20/2014   CHOL 187 09/20/2014   TRIG 164* 09/20/2014   HDL 46 09/20/2014   LDLCALC 108* 09/20/2014   TSH 1.270 09/20/2014    CMP     Component Value Date/Time   NA 140 09/20/2014 0944   K 4.7 09/20/2014 0944   CL 100 09/20/2014 0944   CO2 24 09/20/2014 0944   GLUCOSE 111* 09/20/2014 0944   BUN 16 09/20/2014 0944   CREATININE 1.04 09/20/2014 0944   CALCIUM 9.4 09/20/2014 0944   PROT 6.4 09/20/2014 0944   ALBUMIN 4.2 09/20/2014 0944   AST 20 09/20/2014 0944   ALT 26 09/20/2014 0944   ALKPHOS 97 09/20/2014 0944   BILITOT 0.9 09/20/2014 0944   GFRNONAA 80 09/20/2014 0944   GFRAA 93 09/20/2014 0944    Assessment and Plan :  1. Essential hypertension Stable  2. Hypercholesteremia   3. SVT (supraventricular tachycardia) ([facility]) Stable  4. Hyperglycemia/prediabetes  - POCT HgB A1C- 5.3 today  5. Insomnia   6. Hypogonadism in male Stable.  Followed by Urology.   Patient was seen and examined by Dr. Miguel Aschoff, and noted scribed by Webb Laws, CMA I have done the exam and reviewed the above chart and it is accurate to the best of my knowledge.  Miguel Aschoff MD [facility] 03/27/2015 3:32 PM

## 2015-05-03 ENCOUNTER — Other Ambulatory Visit: Payer: Self-pay | Admitting: Family Medicine

## 2015-08-09 ENCOUNTER — Other Ambulatory Visit: Payer: BLUE CROSS/BLUE SHIELD

## 2015-08-09 DIAGNOSIS — E291 Testicular hypofunction: Secondary | ICD-10-CM

## 2015-08-10 LAB — HEPATIC FUNCTION PANEL
ALT: 37 IU/L (ref 0–44)
AST: 27 IU/L (ref 0–40)
Albumin: 4.4 g/dL (ref 3.5–5.5)
Alkaline Phosphatase: 86 IU/L (ref 39–117)
BILIRUBIN TOTAL: 0.7 mg/dL (ref 0.0–1.2)
Bilirubin, Direct: 0.25 mg/dL (ref 0.00–0.40)
Total Protein: 6.5 g/dL (ref 6.0–8.5)

## 2015-08-10 LAB — PSA: PROSTATE SPECIFIC AG, SERUM: 1 ng/mL (ref 0.0–4.0)

## 2015-08-10 LAB — HEMATOCRIT: HEMATOCRIT: 45 % (ref 37.5–51.0)

## 2015-08-10 LAB — TESTOSTERONE: TESTOSTERONE: 531 ng/dL (ref 264–916)

## 2015-08-16 ENCOUNTER — Ambulatory Visit: Payer: BLUE CROSS/BLUE SHIELD | Admitting: Urology

## 2015-08-16 ENCOUNTER — Other Ambulatory Visit: Payer: Self-pay

## 2015-08-16 ENCOUNTER — Telehealth: Payer: Self-pay | Admitting: Family Medicine

## 2015-08-16 MED ORDER — METOPROLOL SUCCINATE ER 25 MG PO TB24: 25.0000 mg | ORAL_TABLET | Freq: Every day | ORAL | 3 refills | Status: DC

## 2015-08-16 NOTE — Telephone Encounter (Signed)
Pt needs refill on his metoprolol 25mg  x 90 days to [facility].  He is not using [facility] and longer  Pt is out of this medication.  ThanksTeri  All prescriptions have to go to [facility] now. For 90 days.

## 2015-08-18 NOTE — Progress Notes (Signed)
08/19/2015 10:01 AM   Christopher Rose [birth year] ID:[id number]  Referring provider: Jerrol Banana., MD [address] [place], [place] [address]  Chief Complaint  Patient presents with  . Follow-up    HPI: Patient is a 55 year old Caucasian male with hypogonadism, erectile dysfunction and BPH with LUTS who presents today for a 6 months follow up.    Hypogonadism Patient is experiencing a decrease in libido, a lack of energy, a decrease in strength, erections being less strong and a recent deterioration in an ability to play sports.  This is indicated by his responses to the ADAM questionnaire.  He is currently managing his hypogonadism with Clomid 50 mg, 1/2 daily.  His most recent testosterone level was  531 ng/dL on 08/09/2015.        Androgen Deficiency in the Aging Male    [place] Name 08/19/15 0900         Androgen Deficiency in the Aging Male   Do you have a decrease in libido (sex drive) Yes     Do you have lack of energy Yes     Do you have a decrease in strength and/or endurance Yes     Have you lost height No     Have you noticed a decreased "enjoyment of life" No     Are you sad and/or grumpy No     Are your erections less strong Yes     Have you noticed a recent deterioration in your ability to play sports Yes     Are you falling asleep after dinner No     Has there been a recent deterioration in your work performance No        Erectile dysfunction His SHIM score is 19, which is mild to mild ED.   He has been having difficulty with erections for the last few years.   Previous score was 15.  His major complaint is maintaining an erection.  His libido is diminished.   His risk factors for ED are age, BPH, HTN and hypogonadism.  He denies any painful erections or curvatures with his erections.   He has tried Cialis 20 mg, 1/2 tablet in the past with adequate results.        SHIM    [place] Name 08/19/15 [phone]         SHIM: Over the last 6 months:   How do you rate your confidence that you could get and keep an erection? Moderate     When you had erections with sexual stimulation, how often were your erections hard enough for penetration (entering your partner)? Most Times (much more than half the time)     During sexual intercourse, how often were you able to maintain your erection after you had penetrated (entered) your partner? Slightly Difficult     During sexual intercourse, how difficult was it to maintain your erection to completion of intercourse? Slightly Difficult     When you attempted sexual intercourse, how often was it satisfactory for you? Slightly Difficult       SHIM Total Score   SHIM 19        Score: 1-7 Severe ED 8-11 Moderate ED 12-16 Mild-Moderate ED 17-21 Mild ED 22-25 No ED   BPH WITH LUTS His IPSS score today is 1, which is mild lower urinary tract symptomatology. He is delighted  with his quality life due to his urinary symptoms.  His previous IPSS score was 4/2.  He denies any dysuria, hematuria or suprapubic pain.  He also denies any recent fevers, chills, nausea or vomiting.  He does not have a family history of PCa.      IPSS    Row Name 08/19/15 0900         International Prostate Symptom Score   How often have you had the sensation of not emptying your bladder? Not at All     How often have you had to urinate less than every two hours? Not at All     How often have you found you stopped and started again several times when you urinated? Not at All     How often have you found it difficult to postpone urination? Not at All     How often have you had a weak urinary stream? Not at All     How often have you had to strain to start urination? Not at All     How many times did you typically get up at night to urinate? 1 Time     Total IPSS Score 1       Quality of Life due to urinary symptoms   If you were to spend the rest of your life with your urinary condition just the way it is now how would  you feel about that? Delighted        Score:  1-7 Mild 8-19 Moderate 20-35 Severe   PMH: Past Medical History:  Diagnosis Date  . Anal fissure   . BPH (benign prostatic hyperplasia)   . Chronic insomnia   . Chronic sinusitis   . ED (erectile dysfunction)   . HLD (hyperlipidemia)   . HTN (hypertension)   . Hypogonadism in male   . Measles   . Mumps   . Palpitations   . SVT (supraventricular tachycardia) [facility])     Surgical History: Past Surgical History:  Procedure Laterality Date  . cardiac ablation surgery  2010  . HERNIA REPAIR  1990  . VASECTOMY  1992    Home Medications:    Medication List       Accurate as of 08/19/15 10:01 AM. Always use your most recent med list.          CIALIS 20 MG tablet Generic drug:  tadalafil take 1 tablet by mouth if needed   clomiPHENE 50 MG tablet Commonly known as:  CLOMID Take 1/2 tablet daily   meloxicam 15 MG tablet Commonly known as:  MOBIC take 1 tablet by mouth once daily   metoprolol succinate 25 MG 24 hr tablet Commonly known as:  TOPROL-XL Take 1 tablet (25 mg total) by mouth daily.   sildenafil 20 MG tablet Commonly known as:  REVATIO 1 to 5 tablets daily   zolpidem 10 MG tablet Commonly known as:  AMBIEN take 1 tablet by mouth at bedtime if needed       Allergies:  Allergies  Allergen Reactions  . Amoxicillin   . Penicillins     Family History: Family History  Problem Relation Age of Onset  . Diabetes Mellitus II Mother   . Colon cancer Paternal Grandfather   . Heart attack Father   . Kidney disease Neg Hx   . Prostate cancer Neg Hx     Social History:  reports that he has never smoked. He has never used smokeless tobacco. He reports that he drinks alcohol. He reports that he does not use drugs.  ROS: UROLOGY Frequent Urination?: No Hard to  postpone urination?: No Burning/pain with urination?: No Get up at night to urinate?: No Leakage of urine?: No Urine stream starts and  stops?: No Trouble starting stream?: No Do you have to strain to urinate?: No Blood in urine?: No Urinary tract infection?: No Sexually transmitted disease?: No Injury to kidneys or bladder?: No Painful intercourse?: No Weak stream?: No Erection problems?: No Penile pain?: No  Gastrointestinal Nausea?: No Vomiting?: No Indigestion/heartburn?: No Diarrhea?: No Constipation?: No  Constitutional Fever: No Night sweats?: No Weight loss?: No Fatigue?: No  Skin Skin rash/lesions?: No Itching?: No  Eyes Blurred vision?: No Double vision?: No  Ears/Nose/Throat Sore throat?: No Sinus problems?: No  Hematologic/Lymphatic Easy bruising?: No  Cardiovascular Leg swelling?: No Chest pain?: No  Respiratory Cough?: No Shortness of breath?: No  Endocrine Excessive thirst?: No  Musculoskeletal Back pain?: No Joint pain?: No  Neurological Headaches?: No Dizziness?: No  Psychologic Depression?: No Anxiety?: No  Physical Exam: BP (!) 151/95 (BP Location: Left Arm, Patient Position: Sitting, Cuff Size: Normal)   Pulse 70   Ht 5\' 9"  (1.753 m)   Wt 195 lb 11.2 oz (88.8 kg)   BMI 28.90 kg/m   Constitutional: Well nourished. Alert and oriented, No acute distress. HEENT: [place] AT, moist mucus membranes. Trachea midline, no masses. Cardiovascular: No clubbing, cyanosis, or edema. Respiratory: Normal respiratory effort, no increased work of breathing. GI: Abdomen is soft, non tender, non distended, no abdominal masses. Liver and spleen not palpable.  No hernias appreciated.  Stool sample for occult testing is not indicated.   GU: No CVA tenderness.  No bladder fullness or masses.  Patient with circumcised phallus.   Urethral meatus is patent.  No penile discharge. No penile lesions or rashes. Scrotum without lesions, cysts, rashes and/or edema.  Testicles are located scrotally bilaterally. No masses are appreciated in the testicles. Left and right epididymis are  normal. Rectal: Patient with  normal sphincter tone. Anus and perineum without scarring or rashes. No rectal masses are appreciated. Prostate is approximately 50 grams, no nodules are appreciated. Seminal vesicles are normal. Skin: No rashes, bruises or suspicious lesions. Lymph: No cervical or inguinal adenopathy. Neurologic: Grossly intact, no focal deficits, moving all 4 extremities. Psychiatric: Normal mood and affect.  Laboratory Data: Results for orders placed or performed in visit on 08/09/15  PSA  Result Value Ref Range   Prostate Specific Ag, Serum 1.0 0.0 - 4.0 ng/mL  Testosterone  Result Value Ref Range   Testosterone 531 264 - 916 ng/dL  Hematocrit  Result Value Ref Range   Hematocrit 45.0 37.5 - 51.0 %  Hepatic function panel  Result Value Ref Range   Total Protein 6.5 6.0 - 8.5 g/dL   Albumin 4.4 3.5 - 5.5 g/dL   Bilirubin Total 0.7 0.0 - 1.2 mg/dL   Bilirubin, Direct 0.25 0.00 - 0.40 mg/dL   Alkaline Phosphatase 86 39 - 117 IU/L   AST 27 0 - 40 IU/L   ALT 37 0 - 44 IU/L   Lab Results  Component Value Date   WBC 7.4 09/20/2014   HCT 45.0 08/09/2015   MCV 89 09/20/2014   PLT 225 09/20/2014    Lab Results  Component Value Date   CREATININE 1.04 09/20/2014    Lab Results  Component Value Date   TSH 1.270 09/20/2014   Lipid Panel     Component Value Date/Time   CHOL 187 09/20/2014 0944   TRIG 164 (H) 09/20/2014 0944   HDL 46 09/20/2014  0944   LDLCALC 108 (H) 09/20/2014 0944    Lab Results  Component Value Date   AST 27 08/09/2015   Lab Results  Component Value Date   ALT 37 08/09/2015     Assessment & Plan:    1. Hypogonadism:     -most recent testosterone level is 531 ng/dL on 08/09/2015  -continue Clomid 50 mg, 1/2 tablet daily  -RTC in 6 months for HCT, testosterone, PSA, LFT's, ADAM and exam  2. BPH with LUTS  - IPSS score is 1/0, it is improving  - Continue conservative management, avoiding bladder irritants and timed  voiding's  - RTC in 6 months for IPSS, PSA and exam, as testosterone therapy can cause prostate enlargement and worsen LUTS  3. Erectile dysfunction:     -SHIM score is 19  -continue  Cialis  -RTC in 6 months for SHIM score and exam, as testosterone therapy can affect erections   Return in about 6 months (around 02/19/2016) for IPSS, SHIM, ADAM, HCT, LFT, PSA, testosterone before 10 AM and exam.  These notes generated with voice recognition software. I apologize for typographical errors.  Zara Council, [facility] [address] [place], [place] [address] [phone]

## 2015-08-19 ENCOUNTER — Ambulatory Visit (INDEPENDENT_AMBULATORY_CARE_PROVIDER_SITE_OTHER): Payer: BLUE CROSS/BLUE SHIELD | Admitting: Urology

## 2015-08-19 ENCOUNTER — Encounter: Payer: Self-pay | Admitting: Urology

## 2015-08-19 VITALS — BP 151/95 | HR 70 | Ht 69.0 in | Wt 195.7 lb

## 2015-08-19 DIAGNOSIS — N528 Other male erectile dysfunction: Secondary | ICD-10-CM | POA: Diagnosis not present

## 2015-08-19 DIAGNOSIS — N401 Enlarged prostate with lower urinary tract symptoms: Secondary | ICD-10-CM

## 2015-08-19 DIAGNOSIS — N138 Other obstructive and reflux uropathy: Secondary | ICD-10-CM

## 2015-08-19 DIAGNOSIS — N529 Male erectile dysfunction, unspecified: Secondary | ICD-10-CM

## 2015-08-19 DIAGNOSIS — E291 Testicular hypofunction: Secondary | ICD-10-CM | POA: Diagnosis not present

## 2015-08-19 MED ORDER — CLOMIPHENE CITRATE 50 MG PO TABS: ORAL_TABLET | ORAL | 4 refills | Status: DC

## 2015-08-28 ENCOUNTER — Other Ambulatory Visit: Payer: Self-pay | Admitting: Family Medicine

## 2015-08-28 MED ORDER — MELOXICAM 15 MG PO TABS: 15.0000 mg | ORAL_TABLET | Freq: Every day | ORAL | 1 refills | Status: DC

## 2015-08-28 MED ORDER — ZOLPIDEM TARTRATE 10 MG PO TABS: ORAL_TABLET | ORAL | 1 refills | Status: DC

## 2015-08-28 NOTE — Telephone Encounter (Signed)
Done-aa 

## 2015-08-28 NOTE — Telephone Encounter (Signed)
Pt is requesting a new Rx sent to [facility]. Due to a pharmacy change with insurance.  890 day supply.  meloxicam (MOBIC) 15 MG tablet  zolpidem (AMBIEN) 10 MG tablet  CIALIS 20 MG tablet

## 2015-08-28 NOTE — Telephone Encounter (Signed)
Ok for 6 months 

## 2015-09-03 ENCOUNTER — Encounter: Payer: Self-pay | Admitting: Family Medicine

## 2015-10-03 ENCOUNTER — Encounter: Payer: Self-pay | Admitting: Family Medicine

## 2015-10-03 ENCOUNTER — Ambulatory Visit (INDEPENDENT_AMBULATORY_CARE_PROVIDER_SITE_OTHER): Payer: BLUE CROSS/BLUE SHIELD | Admitting: Family Medicine

## 2015-10-03 VITALS — BP 128/80 | HR 72 | Temp 98.1°F | Resp 16 | Ht 69.0 in | Wt 195.0 lb

## 2015-10-03 DIAGNOSIS — Z125 Encounter for screening for malignant neoplasm of prostate: Secondary | ICD-10-CM | POA: Diagnosis not present

## 2015-10-03 DIAGNOSIS — Z0000 Encounter for general adult medical examination without abnormal findings: Secondary | ICD-10-CM

## 2015-10-03 DIAGNOSIS — Z23 Encounter for immunization: Secondary | ICD-10-CM

## 2015-10-03 DIAGNOSIS — Z1211 Encounter for screening for malignant neoplasm of colon: Secondary | ICD-10-CM

## 2015-10-03 LAB — POCT URINALYSIS DIPSTICK
Bilirubin, UA: NEGATIVE
Glucose, UA: NEGATIVE
KETONES UA: NEGATIVE
Leukocytes, UA: NEGATIVE
Nitrite, UA: NEGATIVE
PROTEIN UA: NEGATIVE
RBC UA: NEGATIVE
SPEC GRAV UA: 1.02
UROBILINOGEN UA: 0.2
pH, UA: 5

## 2015-10-03 MED ORDER — SILDENAFIL CITRATE 20 MG PO TABS: ORAL_TABLET | ORAL | 5 refills | Status: DC

## 2015-10-03 NOTE — Progress Notes (Signed)
Patient: Christopher Rose, Male    DOB: 09-Dec-1960, 55 y.o.   MRN: [id number] Visit Date: 10/03/2015  Today's Provider: Wilhemena Durie, MD   Chief Complaint  Patient presents with  . Annual Exam   Subjective:    Annual physical exam Christopher Rose is a 55 y.o. male who presents today for health maintenance and complete physical. He feels well. He reports he is not exercising. He reports he is sleeping fairly well, with the help of a sleeping pill.  ----------------------------------------------------------------- Colonoscopy- 08/26/09 1 polyp (tubular adenoma), hemorrhoids repeat 3 years  Immunization History  Administered Date(s) Administered  . Tdap 08/11/2012     Review of Systems  Constitutional: Positive for fatigue.  HENT: Negative.   Eyes: Negative.   Respiratory: Negative.   Cardiovascular: Negative.   Gastrointestinal: Negative.   Endocrine: Negative.   Genitourinary: Negative.   Musculoskeletal: Positive for arthralgias.  Skin: Negative.   Allergic/Immunologic: Negative.   Neurological: Negative.   Hematological: Negative.   Psychiatric/Behavioral: Negative.     Social History      He  reports that he has never smoked. He has never used smokeless tobacco. He reports that he drinks alcohol. He reports that he does not use drugs.       Social History   Social History  . Marital status: Married    Spouse name: N/A  . Number of children: N/A  . Years of education: N/A   Social History Main Topics  . Smoking status: Never Smoker  . Smokeless tobacco: Never Used  . Alcohol use 0.0 oz/week     Comment: occasional  . Drug use: No  . Sexual activity: Yes   Other Topics Concern  . None   Social History Narrative  . None    Past Medical History:  Diagnosis Date  . Anal fissure   . BPH (benign prostatic hyperplasia)   . Chronic insomnia   . Chronic sinusitis   . ED (erectile dysfunction)   . HLD (hyperlipidemia)   . HTN (hypertension)     . Hypogonadism in male   . Measles   . Mumps   . Palpitations   . SVT (supraventricular tachycardia) [facility])      Patient Active Problem List   Diagnosis Date Noted  . Hyperglycemia 03/27/2015  . Insomnia 03/27/2015  . Erectile dysfunction of organic origin 02/13/2015  . BPH with obstruction/lower urinary tract symptoms 02/13/2015  . Hypogonadism in male 11/12/2014  . Insomnia, persistent 09/20/2014  . ED (erectile dysfunction) of organic origin 09/20/2014  . History of prolonged Q-T interval on ECG 09/20/2014  . Internal hemorrhoids 09/20/2014  . Hypercholesteremia 09/20/2014  . BP (high blood pressure) 09/20/2014  . 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 SVT (supraventricular tachycardia) ([place]) 06/26/2008  . DIZZINESS 06/26/2008    Past Surgical History:  Procedure Laterality Date  . cardiac ablation surgery  2010  . HERNIA REPAIR  1990  . VASECTOMY  1992    Family History        Family Status  Relation Status  . Mother   . Paternal Grandfather   . Father   . Neg Hx         His family history includes Colon cancer in his paternal grandfather; Diabetes Mellitus II in his mother; Heart attack in his father.    Allergies  Allergen Reactions  . Amoxicillin   . Penicillins     Current Meds  Medication Sig  . CIALIS 20  MG tablet take 1 tablet by mouth if needed  . clomiPHENE (CLOMID) 50 MG tablet Take 1/2 tablet daily  . meloxicam (MOBIC) 15 MG tablet Take 1 tablet (15 mg total) by mouth daily.  . metoprolol succinate (TOPROL-XL) 25 MG 24 hr tablet Take 1 tablet (25 mg total) by mouth daily.  . sildenafil (REVATIO) 20 MG tablet 1 to 5 tablets daily  . zolpidem (AMBIEN) 10 MG tablet take 1 tablet by mouth at bedtime if needed    Patient Care Team: Jerrol Banana., MD as PCP - General (Family Medicine)     Objective:   Vitals: There were no vitals taken for this visit.   Physical Exam  Constitutional: He is oriented to person, place, and time. He appears  well-developed and well-nourished.  HENT:  Head: Normocephalic and atraumatic.  Right Ear: External ear normal.  Left Ear: External ear normal.  Nose: Nose normal.  Mouth/Throat: Oropharynx is clear and moist.  Eyes: Conjunctivae and EOM are normal. Pupils are equal, round, and reactive to light.  Neck: Normal range of motion. Neck supple.  Cardiovascular: Normal rate, regular rhythm, normal heart sounds and intact distal pulses.   Pulmonary/Chest: Effort normal and breath sounds normal.  Abdominal: Soft. Bowel sounds are normal.  Genitourinary: Rectum normal, prostate normal and penis normal.  Musculoskeletal: Normal range of motion.  Neurological: He is alert and oriented to person, place, and time. He has normal reflexes.  Skin: Skin is warm and dry.  Psychiatric: He has a normal mood and affect. His behavior is normal. Judgment and thought content normal.     Depression Screen PHQ 2/9 Scores 09/20/2014  PHQ - 2 Score 0      Assessment & Plan:     Routine Health Maintenance and Physical Exam  Exercise Activities and Dietary recommendations Goals    None      Immunization History  Administered Date(s) Administered  . Tdap 08/11/2012    Health Maintenance  Topic Date Due  . HIV Screening  07/27/1974  . INFLUENZA VACCINE  08/06/2015  . COLONOSCOPY  08/27/2019  . TETANUS/TDAP  08/12/2022  . Hepatitis C Screening  Completed     Refer to Dr. Allen Norris for screening colonoscopy Discussed health benefits of physical activity, and encouraged him to engage in regular exercise appropriate for his age and condition.   ED Refill sildenafil.  --------------------------------------------------------------------  I have done the exam and reviewed the chart and it is accurate to the best of my knowledge. Miguel Aschoff M.D. Yoe, MD  [facility]

## 2015-10-04 ENCOUNTER — Telehealth: Payer: Self-pay

## 2015-10-04 LAB — CBC WITH DIFFERENTIAL/PLATELET
BASOS ABS: 0 10*3/uL (ref 0.0–0.2)
Basos: 0 %
EOS (ABSOLUTE): 0.3 10*3/uL (ref 0.0–0.4)
EOS: 3 %
HEMATOCRIT: 42 % (ref 37.5–51.0)
HEMOGLOBIN: 14.9 g/dL (ref 12.6–17.7)
IMMATURE GRANULOCYTES: 0 %
Immature Grans (Abs): 0 10*3/uL (ref 0.0–0.1)
Lymphocytes Absolute: 1.8 10*3/uL (ref 0.7–3.1)
Lymphs: 22 %
MCH: 31.4 pg (ref 26.6–33.0)
MCHC: 35.5 g/dL (ref 31.5–35.7)
MCV: 89 fL (ref 79–97)
MONOCYTES: 8 %
Monocytes Absolute: 0.6 10*3/uL (ref 0.1–0.9)
NEUTROS PCT: 67 %
Neutrophils Absolute: 5.4 10*3/uL (ref 1.4–7.0)
Platelets: 202 10*3/uL (ref 150–379)
RBC: 4.74 x10E6/uL (ref 4.14–5.80)
RDW: 13.8 % (ref 12.3–15.4)
WBC: 8.1 10*3/uL (ref 3.4–10.8)

## 2015-10-04 LAB — COMPREHENSIVE METABOLIC PANEL
ALBUMIN: 4.3 g/dL (ref 3.5–5.5)
ALK PHOS: 94 IU/L (ref 39–117)
ALT: 65 IU/L — ABNORMAL HIGH (ref 0–44)
AST: 38 IU/L (ref 0–40)
Albumin/Globulin Ratio: 2 (ref 1.2–2.2)
BUN / CREAT RATIO: 16 (ref 9–20)
BUN: 17 mg/dL (ref 6–24)
Bilirubin Total: 0.8 mg/dL (ref 0.0–1.2)
CO2: 25 mmol/L (ref 18–29)
CREATININE: 1.09 mg/dL (ref 0.76–1.27)
Calcium: 9.4 mg/dL (ref 8.7–10.2)
Chloride: 103 mmol/L (ref 96–106)
GFR calc non Af Amer: 75 mL/min/{1.73_m2} (ref >59)
GFR, EST AFRICAN AMERICAN: 87 mL/min/{1.73_m2} (ref >59)
GLOBULIN, TOTAL: 2.2 g/dL (ref 1.5–4.5)
Glucose: 103 mg/dL — ABNORMAL HIGH (ref 65–99)
Potassium: 5 mmol/L (ref 3.5–5.2)
SODIUM: 143 mmol/L (ref 134–144)
TOTAL PROTEIN: 6.5 g/dL (ref 6.0–8.5)

## 2015-10-04 LAB — LIPID PANEL WITH LDL/HDL RATIO
CHOLESTEROL TOTAL: 220 mg/dL — AB (ref 100–199)
HDL: 45 mg/dL (ref >39)
LDL CALC: 143 mg/dL — AB (ref 0–99)
LDL/HDL RATIO: 3.2 ratio (ref 0.0–3.6)
TRIGLYCERIDES: 158 mg/dL — AB (ref 0–149)
VLDL Cholesterol Cal: 32 mg/dL (ref 5–40)

## 2015-10-04 LAB — TSH: TSH: 1.48 u[IU]/mL (ref 0.450–4.500)

## 2015-10-04 LAB — PSA: Prostate Specific Ag, Serum: 1.2 ng/mL (ref 0.0–4.0)

## 2015-10-04 NOTE — Telephone Encounter (Signed)
-----   Message from Jerrol Banana., MD sent at 10/04/2015  2:45 PM EDT ----- Labs okay.

## 2015-10-04 NOTE — Telephone Encounter (Signed)
Pt advised.   Thanks,   -Khayman Kirsch  

## 2015-10-07 ENCOUNTER — Other Ambulatory Visit: Payer: Self-pay

## 2015-10-07 ENCOUNTER — Telehealth: Payer: Self-pay

## 2015-10-07 NOTE — Telephone Encounter (Signed)
Gastroenterology Pre-Procedure Review  Request Date: 12/02/2015 Requesting Physician: Dr. Rosanna Randy  PATIENT REVIEW QUESTIONS: The patient responded to the following health history questions as indicated:    1. Are you having any GI issues? no 2. Do you have a personal history of Polyps? yes (benign) 3. Do you have a family history of Colon Cancer or Polyps? yes (father benign polyps, paternal grandfather colon cancer ) 71. Diabetes Mellitus? no 5. Joint replacements in the past 12 months?no 6. Major health problems in the past 3 months?no 7. Any artificial heart valves, MVP, or defibrillator?no    MEDICATIONS & ALLERGIES:    Patient reports the following regarding taking any anticoagulation/antiplatelet therapy:   Plavix, Coumadin, Eliquis, Xarelto, Lovenox, Pradaxa, Brilinta, or Effient? no Aspirin? no  Patient confirms/reports the following medications:  Current Outpatient Prescriptions  Medication Sig Dispense Refill  . CIALIS 20 MG tablet take 1 tablet by mouth if needed 6 tablet 12  . clomiPHENE (CLOMID) 50 MG tablet Take 1/2 tablet daily 90 tablet 4  . meloxicam (MOBIC) 15 MG tablet Take 1 tablet (15 mg total) by mouth daily. 90 tablet 1  . metoprolol succinate (TOPROL-XL) 25 MG 24 hr tablet Take 1 tablet (25 mg total) by mouth daily. 90 tablet 3  . zolpidem (AMBIEN) 10 MG tablet take 1 tablet by mouth at bedtime if needed 90 tablet 1   No current facility-administered medications for this visit.     Patient confirms/reports the following allergies:  Allergies  Allergen Reactions  . Amoxicillin   . Penicillins     No orders of the defined types were placed in this encounter.   AUTHORIZATION INFORMATION Primary Insurance: 1D#: Group #:  Secondary Insurance: 1D#: Group #:  SCHEDULE INFORMATION: Date: 12/02/2015 Time: Location: [facility]

## 2015-10-07 NOTE — Telephone Encounter (Signed)
Screening Colonoscopy Z12.11 [facility] 0000000 [employer] Pre cert is not required

## 2015-10-24 ENCOUNTER — Telehealth: Payer: Self-pay | Admitting: Family Medicine

## 2015-11-25 ENCOUNTER — Encounter: Payer: Self-pay | Admitting: *Deleted

## 2015-11-27 NOTE — Discharge Instructions (Signed)

## 2015-12-02 ENCOUNTER — Ambulatory Visit
Admission: RE | Admit: 2015-12-02 | Discharge: 2015-12-02 | Disposition: A | Discharge status: Home / Self Care | Payer: BLUE CROSS/BLUE SHIELD | Source: Ambulatory Visit | Attending: Gastroenterology | Admitting: Gastroenterology

## 2015-12-02 ENCOUNTER — Ambulatory Visit: Payer: BLUE CROSS/BLUE SHIELD | Admitting: Anesthesiology

## 2015-12-02 ENCOUNTER — Encounter
Admission: RE | Disposition: A | Discharge status: Home / Self Care | Payer: Self-pay | Source: Ambulatory Visit | Attending: Gastroenterology

## 2015-12-02 DIAGNOSIS — Z860100 Personal history of colon polyps, unspecified: Secondary | ICD-10-CM

## 2015-12-02 DIAGNOSIS — I471 Supraventricular tachycardia: Secondary | ICD-10-CM | POA: Diagnosis not present

## 2015-12-02 DIAGNOSIS — Z1211 Encounter for screening for malignant neoplasm of colon: Secondary | ICD-10-CM | POA: Insufficient documentation

## 2015-12-02 DIAGNOSIS — M1990 Unspecified osteoarthritis, unspecified site: Secondary | ICD-10-CM | POA: Diagnosis not present

## 2015-12-02 DIAGNOSIS — G4700 Insomnia, unspecified: Secondary | ICD-10-CM | POA: Diagnosis not present

## 2015-12-02 DIAGNOSIS — D124 Benign neoplasm of descending colon: Secondary | ICD-10-CM | POA: Diagnosis not present

## 2015-12-02 DIAGNOSIS — E785 Hyperlipidemia, unspecified: Secondary | ICD-10-CM | POA: Diagnosis not present

## 2015-12-02 DIAGNOSIS — Z79899 Other long term (current) drug therapy: Secondary | ICD-10-CM | POA: Insufficient documentation

## 2015-12-02 DIAGNOSIS — K648 Other hemorrhoids: Secondary | ICD-10-CM | POA: Diagnosis not present

## 2015-12-02 DIAGNOSIS — N400 Enlarged prostate without lower urinary tract symptoms: Secondary | ICD-10-CM | POA: Diagnosis not present

## 2015-12-02 DIAGNOSIS — I10 Essential (primary) hypertension: Secondary | ICD-10-CM | POA: Insufficient documentation

## 2015-12-02 HISTORY — DX: Unspecified osteoarthritis, unspecified site: M19.90

## 2015-12-02 HISTORY — PX: POLYPECTOMY: SHX5525

## 2015-12-02 HISTORY — PX: COLONOSCOPY WITH PROPOFOL: SHX5780

## 2015-12-02 SURGERY — COLONOSCOPY WITH PROPOFOL
Anesthesia: Monitor Anesthesia Care | Wound class: Contaminated

## 2015-12-02 MED ORDER — LACTATED RINGERS IV SOLN
INTRAVENOUS | Status: DC
  Administered 2015-12-02: 09:00:00 via INTRAVENOUS

## 2015-12-02 MED ORDER — LIDOCAINE HCL (CARDIAC) 20 MG/ML IV SOLN
INTRAVENOUS | Status: DC | PRN
  Administered 2015-12-02: 40 mg via INTRAVENOUS

## 2015-12-02 MED ORDER — STERILE WATER FOR IRRIGATION IR SOLN
Status: DC | PRN
  Administered 2015-12-02: 10:00:00

## 2015-12-02 MED ORDER — PROPOFOL 10 MG/ML IV BOLUS
INTRAVENOUS | Status: DC | PRN
  Administered 2015-12-02: 50 mg via INTRAVENOUS
  Administered 2015-12-02: 100 mg via INTRAVENOUS
  Administered 2015-12-02 (×3): 50 mg via INTRAVENOUS

## 2015-12-02 SURGICAL SUPPLY — 23 items
CANISTER SUCT 1200ML W/VALVE (MISCELLANEOUS) ×4 IMPLANT
CLIP HMST 235XBRD CATH ROT (MISCELLANEOUS) IMPLANT
CLIP RESOLUTION 360 11X235 (MISCELLANEOUS)
FCP ESCP3.2XJMB 240X2.8X (MISCELLANEOUS)
FORCEPS BIOP RAD 4 LRG CAP 4 (CUTTING FORCEPS) ×2 IMPLANT
FORCEPS BIOP RJ4 240 W/NDL (MISCELLANEOUS)
FORCEPS ESCP3.2XJMB 240X2.8X (MISCELLANEOUS) IMPLANT
GOWN CVR UNV OPN BCK APRN NK (MISCELLANEOUS) ×4 IMPLANT
GOWN ISOL THUMB LOOP REG UNIV (MISCELLANEOUS) ×8
INJECTOR VARIJECT VIN23 (MISCELLANEOUS) IMPLANT
KIT DEFENDO VALVE AND CONN (KITS) IMPLANT
KIT ENDO PROCEDURE OLY (KITS) ×4 IMPLANT
MARKER SPOT ENDO TATTOO 5ML (MISCELLANEOUS) IMPLANT
PAD GROUND ADULT SPLIT (MISCELLANEOUS) IMPLANT
PROBE APC STR FIRE (PROBE) IMPLANT
RETRIEVER NET ROTH 2.5X230 LF (MISCELLANEOUS) IMPLANT
SNARE SHORT THROW 13M SML OVAL (MISCELLANEOUS) IMPLANT
SNARE SHORT THROW 30M LRG OVAL (MISCELLANEOUS) IMPLANT
SNARE SNG USE RND 15MM (INSTRUMENTS) IMPLANT
SPOT EX ENDOSCOPIC TATTOO (MISCELLANEOUS)
TRAP ETRAP POLY (MISCELLANEOUS) IMPLANT
VARIJECT INJECTOR VIN23 (MISCELLANEOUS)
WATER STERILE IRR 250ML POUR (IV SOLUTION) ×4 IMPLANT

## 2015-12-02 NOTE — H&P (Signed)
Lucilla Lame, MD [address] [place], [place] [address] Phone: [phone] Fax : [phone]  Primary Care Physician:  Wilhemena Durie, MD Primary Gastroenterologist:  Dr. Allen Norris  Pre-Procedure History & Physical: HPI:  Christopher Rose is a 55 y.o. male is here for an colonoscopy.   Past Medical History:  Diagnosis Date  . Anal fissure   . Arthritis    knees  . BPH (benign prostatic hyperplasia)   . Chronic insomnia   . Chronic sinusitis   . ED (erectile dysfunction)   . HLD (hyperlipidemia)   . HTN (hypertension)   . Hypogonadism in male   . Measles   . Mumps   . Palpitations   . SVT (supraventricular tachycardia) ([facility])     Past Surgical History:  Procedure Laterality Date  . cardiac ablation surgery  2010  . HERNIA REPAIR  1990  . VASECTOMY  1992    Prior to Admission medications   Medication Sig Start Date End Date Taking? Authorizing Provider  CIALIS 20 MG tablet take 1 tablet by mouth if needed 03/02/15  Yes Richard Maceo Pro., MD  clomiPHENE (CLOMID) 50 MG tablet Take 1/2 tablet daily 08/19/15  Yes Shannon A McGowan, PA-C  meloxicam (MOBIC) 15 MG tablet Take 1 tablet (15 mg total) by mouth daily. 08/28/15  Yes Richard Maceo Pro., MD  metoprolol succinate (TOPROL-XL) 25 MG 24 hr tablet Take 1 tablet (25 mg total) by mouth daily. 08/16/15  Yes Richard Maceo Pro., MD  zolpidem Lorrin Mais) 10 MG tablet take 1 tablet by mouth at bedtime if needed 08/28/15  Yes Jerrol Banana., MD    Allergies as of 10/07/2015 - Review Complete 10/07/2015  Allergen Reaction Noted  . Amoxicillin  07/05/2008  . Penicillins  07/18/2014    Family History  Problem Relation Age of Onset  . Diabetes Mellitus II Mother   . Colon cancer Paternal Grandfather   . Heart attack Father   . Kidney disease Neg Hx   . Prostate cancer Neg Hx     Social History   Social History  . Marital status: Married    Spouse name: N/A  . Number of children: N/A  . Years of  education: N/A   Occupational History  . Not on file.   Social History Main Topics  . Smoking status: Never Smoker  . Smokeless tobacco: Never Used  . Alcohol use 0.0 oz/week     Comment: occasional - holidays  . Drug use: No  . Sexual activity: Yes   Other Topics Concern  . Not on file   Social History Narrative  . No narrative on file    Review of Systems: See HPI, otherwise negative ROS  Physical Exam: Pulse (!) 109   Temp 98 F (36.7 C) (Tympanic)   Resp 16   Ht 5\' 9"  (1.753 m)   Wt 189 lb (85.7 kg)   SpO2 97%   BMI 27.91 kg/m  General:   Alert,  pleasant and cooperative in NAD Head:  Normocephalic and atraumatic. Neck:  Supple; no masses or thyromegaly. Lungs:  Clear throughout to auscultation.    Heart:  Regular rate and rhythm. Abdomen:  Soft, nontender and nondistended. Normal bowel sounds, without guarding, and without rebound.   Neurologic:  Alert and  oriented x4;  grossly normal neurologically.  Impression/Plan: Christopher Rose is here for an colonoscopy to be performed for history of polyps  Risks, benefits, limitations, and alternatives regarding  colonoscopy have been reviewed with the patient.  Questions have been answered.  All parties agreeable.   Lucilla Lame, MD  12/02/2015, 9:22 AM

## 2015-12-02 NOTE — Anesthesia Preprocedure Evaluation (Signed)
Anesthesia Evaluation  Patient identified by MRN, date of birth, ID band Patient awake    Reviewed: Allergy & Precautions, H&P , NPO status , Patient's Chart, lab work & pertinent test results, reviewed documented beta blocker date and time   Airway Mallampati: II  TM Distance: >3 FB Neck ROM: full    Dental no notable dental hx.    Pulmonary neg pulmonary ROS,    Pulmonary exam normal breath sounds clear to auscultation       Cardiovascular Exercise Tolerance: Good hypertension, Supra Ventricular Tachycardia  Rhythm:regular Rate:Normal     Neuro/Psych negative neurological ROS  negative psych ROS   GI/Hepatic negative GI ROS, Neg liver ROS,   Endo/Other  negative endocrine ROS  Renal/GU negative Renal ROS  negative genitourinary   Musculoskeletal   Abdominal   Peds  Hematology negative hematology ROS (+)   Anesthesia Other Findings   Reproductive/Obstetrics negative OB ROS                             Anesthesia Physical Anesthesia Plan  ASA: II  Anesthesia Plan: MAC   Post-op Pain Management:    Induction:   Airway Management Planned:   Additional Equipment:   Intra-op Plan:   Post-operative Plan:   Informed Consent: I have reviewed the patients History and Physical, chart, labs and discussed the procedure including the risks, benefits and alternatives for the proposed anesthesia with the patient or authorized representative who has indicated his/her understanding and acceptance.   Dental Advisory Given  Plan Discussed with: CRNA  Anesthesia Plan Comments:         Anesthesia Quick Evaluation

## 2015-12-02 NOTE — Anesthesia Procedure Notes (Signed)
Procedure Name: MAC Date/Time: 12/02/2015 9:52 AM Performed by: Janna Arch Pre-anesthesia Checklist: Patient identified, Emergency Drugs available, Suction available and Patient being monitored Patient Re-evaluated:Patient Re-evaluated prior to inductionOxygen Delivery Method: Nasal cannula

## 2015-12-02 NOTE — Anesthesia Postprocedure Evaluation (Signed)
Anesthesia Post Note  Patient: Christopher Rose  Procedure(s) Performed: Procedure(s) (LRB): COLONOSCOPY WITH PROPOFOL (N/A) POLYPECTOMY  Patient location during evaluation: PACU Anesthesia Type: MAC Level of consciousness: awake and alert Pain management: pain level controlled Vital Signs Assessment: post-procedure vital signs reviewed and stable Respiratory status: spontaneous breathing, nonlabored ventilation, respiratory function stable and patient connected to nasal cannula oxygen Cardiovascular status: stable and blood pressure returned to baseline Anesthetic complications: no    Alisa Graff

## 2015-12-02 NOTE — Transfer of Care (Signed)
Immediate Anesthesia Transfer of Care Note  Patient: Christopher Rose  Procedure(s) Performed: Procedure(s): COLONOSCOPY WITH PROPOFOL (N/A) POLYPECTOMY  Patient Location: PACU  Anesthesia Type: MAC  Level of Consciousness: awake, alert  and patient cooperative  Airway and Oxygen Therapy: Patient Spontanous Breathing and Patient connected to supplemental oxygen  Post-op Assessment: Post-op Vital signs reviewed, Patient's Cardiovascular Status Stable, Respiratory Function Stable, Patent Airway and No signs of Nausea or vomiting  Post-op Vital Signs: Reviewed and stable  Complications: No apparent anesthesia complications

## 2015-12-02 NOTE — Op Note (Signed)
[facility] Patient Name: Christopher Rose Procedure Date: 12/02/2015 9:44 AM MRN: [id number] Account #: [id number] Date of Birth: 07/09/1960 Admit Type: Outpatient Age: 55 Room: [facility] OR ROOM 01 Gender: Male Note Status: Finalized Procedure:            Colonoscopy Indications:          High risk colon cancer surveillance: Personal history                        of colonic polyps Providers:            Lucilla Lame MD, MD Referring MD:         Janine Ores. Rosanna Randy, MD (Referring MD) Medicines:            Propofol per Anesthesia Complications:        No immediate complications. Procedure:            Pre-Anesthesia Assessment:                       - Prior to the procedure, a History and Physical was                        performed, and patient medications and allergies were                        reviewed. The patient's tolerance of previous                        anesthesia was also reviewed. The risks and benefits of                        the procedure and the sedation options and risks were                        discussed with the patient. All questions were                        answered, and informed consent was obtained. Prior                        Anticoagulants: The patient has taken no previous                        anticoagulant or antiplatelet agents. ASA Grade                        Assessment: II - A patient with mild systemic disease.                        After reviewing the risks and benefits, the patient was                        deemed in satisfactory condition to undergo the                        procedure.                       After obtaining informed consent, the colonoscope was  passed under direct vision. Throughout the procedure,                        the patient's blood pressure, pulse, and oxygen                        saturations were monitored continuously. The was                         introduced through the anus and advanced to the the                        cecum, identified by appendiceal orifice and ileocecal                        valve. The colonoscopy was performed without                        difficulty. The patient tolerated the procedure well.                        The quality of the bowel preparation was excellent. Findings:      The perianal and digital rectal examinations were normal.      Two sessile polyps were found in the descending colon. The polyps were 2       to 3 mm in size. These polyps were removed with a cold biopsy forceps.       Resection and retrieval were complete.      Non-bleeding internal hemorrhoids were found during retroflexion. The       hemorrhoids were Grade II (internal hemorrhoids that prolapse but reduce       spontaneously). Impression:           - Two 2 to 3 mm polyps in the descending colon, removed                        with a cold biopsy forceps. Resected and retrieved.                       - Non-bleeding internal hemorrhoids. Recommendation:       - Discharge patient to home.                       - Resume previous diet.                       - Continue present medications.                       - Await pathology results.                       - Repeat colonoscopy in 5 years for surveillance. Procedure Code(s):    --- Professional ---                       [phone], Colonoscopy, flexible; with biopsy, single or                        multiple Diagnosis Code(s):    --- Professional ---  Z86.010, Personal history of colonic polyps                       D12.4, Benign neoplasm of descending colon CPT copyright 2016 American Medical Association. All rights reserved. The codes documented in this report are preliminary and upon coder review may  be revised to meet current compliance requirements. Lucilla Lame MD, MD 12/02/2015 10:09:25 AM This report has been signed electronically. Number of Addenda:  0 Note Initiated On: 12/02/2015 9:44 AM Scope Withdrawal Time: 0 hours 7 minutes 47 seconds  Total Procedure Duration: 0 hours 10 minutes 43 seconds       [facility]

## 2015-12-03 ENCOUNTER — Encounter: Payer: Self-pay | Admitting: Gastroenterology

## 2015-12-04 ENCOUNTER — Encounter: Payer: Self-pay | Admitting: Gastroenterology

## 2015-12-06 ENCOUNTER — Telehealth: Payer: Self-pay

## 2015-12-06 NOTE — Telephone Encounter (Signed)
Patient's wife states they spoke to their insurance and patient was advised that Viagra (brand or generic) wont be covered but Cialis will be after doing PA for it. Can we proceed with this?-aa

## 2015-12-07 NOTE — Telephone Encounter (Signed)
Can try--I will be surprised if covered.

## 2015-12-09 MED ORDER — TADALAFIL 20 MG PO TABS: ORAL_TABLET | ORAL | 12 refills | Status: DC

## 2015-12-09 NOTE — Telephone Encounter (Signed)
rx sent in-aa 

## 2016-02-07 ENCOUNTER — Other Ambulatory Visit: Payer: BLUE CROSS/BLUE SHIELD

## 2016-02-07 DIAGNOSIS — E291 Testicular hypofunction: Secondary | ICD-10-CM

## 2016-02-08 LAB — HEPATIC FUNCTION PANEL
ALBUMIN: 4.4 g/dL (ref 3.5–5.5)
ALK PHOS: 87 IU/L (ref 39–117)
ALT: 52 IU/L — ABNORMAL HIGH (ref 0–44)
AST: 39 IU/L (ref 0–40)
BILIRUBIN TOTAL: 0.8 mg/dL (ref 0.0–1.2)
BILIRUBIN, DIRECT: 0.31 mg/dL (ref 0.00–0.40)
TOTAL PROTEIN: 6.2 g/dL (ref 6.0–8.5)

## 2016-02-08 LAB — HEMATOCRIT: Hematocrit: 42.6 % (ref 37.5–51.0)

## 2016-02-08 LAB — TESTOSTERONE: Testosterone: 573 ng/dL (ref 264–916)

## 2016-02-08 LAB — PSA: Prostate Specific Ag, Serum: 1.1 ng/mL (ref 0.0–4.0)

## 2016-02-14 ENCOUNTER — Other Ambulatory Visit: Payer: BLUE CROSS/BLUE SHIELD

## 2016-02-17 ENCOUNTER — Other Ambulatory Visit: Payer: Self-pay | Admitting: Family Medicine

## 2016-02-20 ENCOUNTER — Ambulatory Visit: Payer: BLUE CROSS/BLUE SHIELD | Admitting: Urology

## 2016-02-20 ENCOUNTER — Encounter: Payer: Self-pay | Admitting: Urology

## 2016-02-20 VITALS — BP 149/91 | HR 79 | Ht 70.0 in | Wt 200.0 lb

## 2016-02-20 DIAGNOSIS — N529 Male erectile dysfunction, unspecified: Secondary | ICD-10-CM | POA: Diagnosis not present

## 2016-02-20 DIAGNOSIS — E291 Testicular hypofunction: Secondary | ICD-10-CM | POA: Diagnosis not present

## 2016-02-20 DIAGNOSIS — N401 Enlarged prostate with lower urinary tract symptoms: Secondary | ICD-10-CM

## 2016-02-20 DIAGNOSIS — N138 Other obstructive and reflux uropathy: Secondary | ICD-10-CM | POA: Diagnosis not present

## 2016-02-20 MED ORDER — CLOMIPHENE CITRATE 50 MG PO TABS: ORAL_TABLET | ORAL | 4 refills | Status: DC

## 2016-02-20 NOTE — Progress Notes (Signed)
02/20/2016 4:29 PM   [place] Dec 02, 1960 ID:[id number]  Referring provider: Jerrol Banana., MD [address] [place], [place] [address]  Chief Complaint  Patient presents with  . Hypogonadism    6 month follow up    HPI: Patient is a 56 year old Caucasian male with hypogonadism, erectile dysfunction and BPH with LUTS who presents today for a 6 months follow up.    Hypogonadism Patient is experiencing a lack of energy and a recent deterioration in an ability to play sports.  This is indicated by his responses to the ADAM questionnaire.  He is currently managing his hypogonadism with Clomid 50 mg, 1/2 daily.  His most recent testosterone level was  573 ng/dL on 02/07/2016.        Androgen Deficiency in the Aging Male    [place] Name 02/20/16 1600         Androgen Deficiency in the Aging Male   Do you have a decrease in libido (sex drive) No     Do you have lack of energy Yes     Do you have a decrease in strength and/or endurance No     Have you lost height No     Have you noticed a decreased "enjoyment of life" No     Are you sad and/or grumpy No     Are your erections less strong No     Have you noticed a recent deterioration in your ability to play sports Yes     Are you falling asleep after dinner No     Has there been a recent deterioration in your work performance No        Erectile dysfunction His SHIM score is 24, which is mild to no ED.   His previous SHIM was 19.  He has been having difficulty with erections for the last few years.   Previous score was 15.  His major complaint is maintaining an erection.  His libido is diminished.   His risk factors for ED are age, BPH, HTN and hypogonadism.  He denies any painful erections or curvatures with his erections.   He has tried Cialis 20 mg, 1/2 tablet in the past with adequate results.      SHIM    Row Name 02/20/16 1613         SHIM: Over the last 6 months:   How do you rate your confidence  that you could get and keep an erection? High     When you had erections with sexual stimulation, how often were your erections hard enough for penetration (entering your partner)? Almost Always or Always     During sexual intercourse, how often were you able to maintain your erection after you had penetrated (entered) your partner? Not Difficult     During sexual intercourse, how difficult was it to maintain your erection to completion of intercourse? Not Difficult     When you attempted sexual intercourse, how often was it satisfactory for you? Not Difficult       SHIM Total Score   SHIM 24        Score: 1-7 Severe ED 8-11 Moderate ED 12-16 Mild-Moderate ED 17-21 Mild ED 22-25 No ED   BPH WITH LUTS His IPSS score today is 5, which is mild lower urinary tract symptomatology. He is delighted  with his quality life due to his urinary symptoms.  His previous IPSS score was 1/0.  He denies any  dysuria, hematuria or suprapubic pain.  He also denies any recent fevers, chills, nausea or vomiting.  He does not have a family history of PCa.      IPSS    Row Name 02/20/16 1600         International Prostate Symptom Score   How often have you had the sensation of not emptying your bladder? Less than 1 in 5     How often have you had to urinate less than every two hours? Less than 1 in 5 times     How often have you found you stopped and started again several times when you urinated? Less than 1 in 5 times     How often have you found it difficult to postpone urination? Not at All     How often have you had a weak urinary stream? Less than 1 in 5 times     How often have you had to strain to start urination? Not at All     How many times did you typically get up at night to urinate? 1 Time     Total IPSS Score 5       Quality of Life due to urinary symptoms   If you were to spend the rest of your life with your urinary condition just the way it is now how would you feel about that?  Delighted        Score:  1-7 Mild 8-19 Moderate 20-35 Severe   PMH: Past Medical History:  Diagnosis Date  . Anal fissure   . Arthritis    knees  . BPH (benign prostatic hyperplasia)   . Chronic insomnia   . Chronic sinusitis   . ED (erectile dysfunction)   . HLD (hyperlipidemia)   . HTN (hypertension)   . Hypogonadism in male   . Measles   . Mumps   . Palpitations   . SVT (supraventricular tachycardia) [facility])     Surgical History: Past Surgical History:  Procedure Laterality Date  . cardiac ablation surgery  2010  . COLONOSCOPY WITH PROPOFOL N/A 12/02/2015   Procedure: COLONOSCOPY WITH PROPOFOL;  Surgeon: Lucilla Lame, MD;  Location: [place];  Service: Endoscopy;  Laterality: N/A;  . HERNIA REPAIR  1990  . POLYPECTOMY  12/02/2015   Procedure: POLYPECTOMY;  Surgeon: Lucilla Lame, MD;  Location: [place];  Service: Endoscopy;;  . VASECTOMY  1992    Home Medications:  Allergies as of 02/20/2016      Reactions   Penicillins    Amoxicillin Rash      Medication List       Accurate as of 02/20/16  4:29 PM. Always use your most recent med list.          clomiPHENE 50 MG tablet Commonly known as:  CLOMID Take 1/2 tablet daily   meloxicam 15 MG tablet Commonly known as:  MOBIC TAKE 1 TABLET (15 MG TOTAL) BY MOUTH DAILY.   metoprolol succinate 25 MG 24 hr tablet Commonly known as:  TOPROL-XL Take 1 tablet (25 mg total) by mouth daily.   tadalafil 20 MG tablet Commonly known as:  CIALIS take 1 tablet by mouth if needed   zolpidem 10 MG tablet Commonly known as:  AMBIEN take 1 tablet by mouth at bedtime if needed       Allergies:  Allergies  Allergen Reactions  . Penicillins   . Amoxicillin Rash    Family History: Family History  Problem Relation Age of  Onset  . Diabetes Mellitus II Mother   . Colon cancer Paternal Grandfather   . Heart attack Father   . Kidney disease Neg Hx   . Prostate cancer Neg Hx   . Bladder  Cancer Neg Hx     Social History:  reports that he has never smoked. He has never used smokeless tobacco. He reports that he drinks alcohol. He reports that he does not use drugs.  ROS: UROLOGY Frequent Urination?: No Hard to postpone urination?: No Burning/pain with urination?: No Get up at night to urinate?: No Leakage of urine?: No Urine stream starts and stops?: No Trouble starting stream?: No Do you have to strain to urinate?: No Blood in urine?: No Urinary tract infection?: No Sexually transmitted disease?: No Injury to kidneys or bladder?: No Painful intercourse?: No Weak stream?: No Erection problems?: No Penile pain?: No  Gastrointestinal Nausea?: No Vomiting?: No Indigestion/heartburn?: No Diarrhea?: No Constipation?: No  Constitutional Fever: No Night sweats?: No Weight loss?: No Fatigue?: No  Skin Skin rash/lesions?: No Itching?: No  Eyes Blurred vision?: No Double vision?: No  Ears/Nose/Throat Sore throat?: No Sinus problems?: No  Hematologic/Lymphatic Swollen glands?: No Easy bruising?: No  Cardiovascular Leg swelling?: No Chest pain?: No  Respiratory Cough?: No Shortness of breath?: No  Endocrine Excessive thirst?: No  Musculoskeletal Back pain?: No Joint pain?: No  Neurological Headaches?: No Dizziness?: No  Psychologic Depression?: No Anxiety?: No  Physical Exam: BP (!) 149/91   Pulse 79   Ht 5\' 10"  (1.778 m)   Wt 200 lb (90.7 kg)   BMI 28.70 kg/m   Constitutional: Well nourished. Alert and oriented, No acute distress. HEENT: [place] AT, moist mucus membranes. Trachea midline, no masses. Cardiovascular: No clubbing, cyanosis, or edema. Respiratory: Normal respiratory effort, no increased work of breathing. GI: Abdomen is soft, non tender, non distended, no abdominal masses. Liver and spleen not palpable.  No hernias appreciated.  Stool sample for occult testing is not indicated.   GU: No CVA tenderness.  No bladder  fullness or masses.  Patient with circumcised phallus.   Urethral meatus is patent.  No penile discharge. No penile lesions or rashes. Scrotum without lesions, cysts, rashes and/or edema.  Testicles are located scrotally bilaterally. No masses are appreciated in the testicles. Left and right epididymis are normal. Rectal: Patient with  normal sphincter tone. Anus and perineum without scarring or rashes. No rectal masses are appreciated. Prostate is approximately 50 grams, no nodules are appreciated. Seminal vesicles are normal. Skin: No rashes, bruises or suspicious lesions. Lymph: No cervical or inguinal adenopathy. Neurologic: Grossly intact, no focal deficits, moving all 4 extremities. Psychiatric: Normal mood and affect.  Laboratory Data: Results for orders placed or performed in visit on 02/07/16  Hepatic function panel  Result Value Ref Range   Total Protein 6.2 6.0 - 8.5 g/dL   Albumin 4.4 3.5 - 5.5 g/dL   Bilirubin Total 0.8 0.0 - 1.2 mg/dL   Bilirubin, Direct 0.31 0.00 - 0.40 mg/dL   Alkaline Phosphatase 87 39 - 117 IU/L   AST 39 0 - 40 IU/L   ALT 52 (H) 0 - 44 IU/L  Hematocrit  Result Value Ref Range   Hematocrit 42.6 37.5 - 51.0 %  PSA  Result Value Ref Range   Prostate Specific Ag, Serum 1.1 0.0 - 4.0 ng/mL  Testosterone  Result Value Ref Range   Testosterone 573 264 - 916 ng/dL   Lab Results  Component Value Date   WBC  8.1 10/03/2015   HCT 42.6 02/07/2016   MCV 89 10/03/2015   PLT 202 10/03/2015    Lab Results  Component Value Date   CREATININE 1.09 10/03/2015    Lab Results  Component Value Date   TSH 1.480 10/03/2015   Lipid Panel     Component Value Date/Time   CHOL 220 (H) 10/03/2015 0949   TRIG 158 (H) 10/03/2015 0949   HDL 45 10/03/2015 0949   LDLCALC 143 (H) 10/03/2015 0949    Lab Results  Component Value Date   AST 39 02/07/2016   Lab Results  Component Value Date   ALT 52 (H) 02/07/2016     Assessment & Plan:    1. Hypogonadism:      -most recent testosterone level is 573 ng/dL on 02/07/2016  -continue Clomid 50 mg, 1/2 tablet daily; refills given  -RTC in 6 months for HCT, testosterone, PSA, LFT's, ADAM and exam  2. BPH with LUTS  - IPSS score is 5/0, it is worsening  - Continue conservative management, avoiding bladder irritants and timed voiding's  - RTC in 6 months for IPSS, PSA and exam, as testosterone therapy can cause prostate enlargement and worsen LUTS  3. Erectile dysfunction:     -SHIM score is 24  -continue  Cialis  -RTC in 6 months for SHIM score and exam, as testosterone therapy can affect erections   Return for PSA, LFT's, HCT, testosterone (before 10 AM), ADAM, IPSS, SHIM and exam.  These notes generated with voice recognition software. I apologize for typographical errors.  Zara Council, [facility] [address], [place], [place] [address] [phone]

## 2016-02-21 ENCOUNTER — Ambulatory Visit: Payer: BLUE CROSS/BLUE SHIELD | Admitting: Urology

## 2016-03-11 ENCOUNTER — Encounter: Payer: Self-pay | Admitting: Family Medicine

## 2016-03-11 ENCOUNTER — Ambulatory Visit: Payer: BLUE CROSS/BLUE SHIELD | Admitting: Family Medicine

## 2016-03-11 ENCOUNTER — Ambulatory Visit (INDEPENDENT_AMBULATORY_CARE_PROVIDER_SITE_OTHER): Payer: BLUE CROSS/BLUE SHIELD | Admitting: Family Medicine

## 2016-03-11 VITALS — BP 138/86 | HR 60 | Temp 98.1°F | Resp 16 | Wt 202.0 lb

## 2016-03-11 DIAGNOSIS — I10 Essential (primary) hypertension: Secondary | ICD-10-CM

## 2016-03-11 DIAGNOSIS — G4700 Insomnia, unspecified: Secondary | ICD-10-CM

## 2016-03-11 MED ORDER — METOPROLOL SUCCINATE ER 50 MG PO TB24: 50.0000 mg | ORAL_TABLET | Freq: Every day | ORAL | 12 refills | Status: DC

## 2016-03-11 NOTE — Progress Notes (Signed)
Subjective:  HPI  Hypertension, follow-up:  BP Readings from Last 3 Encounters:  03/11/16 138/86  02/20/16 (!) 149/91  12/02/15 (!) 119/93    He was last seen for hypertension 6 months ago.  BP at that visit was 149/91. Management since that visit includes none. He reports good compliance with treatment. He is not having side effects.  He is not exercising. He is adherent to low salt diet.   Outside blood pressures are not being checked. He is experiencing none.  Patient denies chest pain, chest pressure/discomfort, claudication, dyspnea, exertional chest pressure/discomfort, fatigue, irregular heart beat, lower extremity edema, near-syncope, orthopnea, palpitations, paroxysmal nocturnal dyspnea, syncope and tachypnea.   Cardiovascular risk factors include hypertension and male gender.  Wt Readings from Last 3 Encounters:  03/11/16 202 lb (91.6 kg)  02/20/16 200 lb (90.7 kg)  12/02/15 189 lb (85.7 kg)   ------------------------------------------------------------------------ Insomnia- Pt is taking Ambien most nights but has not used any this week but he is also not working.    Prior to Admission medications   Medication Sig Start Date End Date Taking? Authorizing Provider  clomiPHENE (CLOMID) 50 MG tablet Take 1/2 tablet daily 02/20/16   Larene Beach A McGowan, PA-C  meloxicam (MOBIC) 15 MG tablet TAKE 1 TABLET (15 MG TOTAL) BY MOUTH DAILY. 02/17/16   Nancy Arvin Maceo Pro., MD  metoprolol succinate (TOPROL-XL) 25 MG 24 hr tablet Take 1 tablet (25 mg total) by mouth daily. 08/16/15   Derenda Giddings Maceo Pro., MD  tadalafil (CIALIS) 20 MG tablet take 1 tablet by mouth if needed 12/09/15   Jerrol Banana., MD  zolpidem Lorrin Mais) 10 MG tablet take 1 tablet by mouth at bedtime if needed 08/28/15   Jerrol Banana., MD    Patient Active Problem List   Diagnosis Date Noted  . Hx of colonic polyps   . Benign neoplasm of descending colon   . Insomnia 03/27/2015  . Erectile  dysfunction of organic origin 02/13/2015  . BPH with obstruction/lower urinary tract symptoms 02/13/2015  . Hypogonadism in male 11/12/2014  . Insomnia, persistent 09/20/2014  . ED (erectile dysfunction) of organic origin 09/20/2014  . History of prolonged Q-T interval on ECG 09/20/2014  . Internal hemorrhoids 09/20/2014  . Hypercholesteremia 09/20/2014  . BP (high blood pressure) 09/20/2014  . 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 SVT (supraventricular tachycardia) ([place]) 06/26/2008  . DIZZINESS 06/26/2008    Past Medical History:  Diagnosis Date  . Anal fissure   . Arthritis    knees  . BPH (benign prostatic hyperplasia)   . Chronic insomnia   . Chronic sinusitis   . ED (erectile dysfunction)   . HLD (hyperlipidemia)   . HTN (hypertension)   . Hypogonadism in male   . Measles   . Mumps   . Palpitations   . SVT (supraventricular tachycardia) ([facility])     Social History   Social History  . Marital status: Married    Spouse name: N/A  . Number of children: N/A  . Years of education: N/A   Occupational History  . Not on file.   Social History Main Topics  . Smoking status: Never Smoker  . Smokeless tobacco: Never Used  . Alcohol use 0.0 oz/week     Comment: occasional - holidays  . Drug use: No  . Sexual activity: Yes   Other Topics Concern  . Not on file   Social History Narrative  . No narrative on file    Allergies  Allergen Reactions  . Penicillins   . Amoxicillin Rash    Review of Systems  Constitutional: Negative.   HENT: Negative.   Eyes: Negative.   Respiratory: Negative.   Cardiovascular: Negative.   Gastrointestinal: Negative.   Genitourinary: Negative.   Musculoskeletal: Negative.   Skin: Negative.   Neurological: Negative.   Endo/Heme/Allergies: Negative.   Psychiatric/Behavioral: Negative.     Immunization History  Administered Date(s) Administered  . Influenza,inj,Quad PF,36+ Mos 10/03/2015  . Tdap 08/11/2012    Objective:  BP 138/86  (BP Location: Left Arm, Patient Position: Sitting, Cuff Size: Normal)   Pulse 60   Temp 98.1 F (36.7 C) (Oral)   Resp 16   Wt 202 lb (91.6 kg)   BMI 28.98 kg/m   Physical Exam  Constitutional: He is oriented to person, place, and time and well-developed, well-nourished, and in no distress.  Eyes: Conjunctivae and EOM are normal. Pupils are equal, round, and reactive to light.  Neck: Normal range of motion. Neck supple.  Cardiovascular: Normal rate, regular rhythm, normal heart sounds and intact distal pulses.   Pulmonary/Chest: Effort normal and breath sounds normal.  Musculoskeletal: Normal range of motion.  Neurological: He is alert and oriented to person, place, and time. He has normal reflexes. Gait normal. GCS score is 15.  Skin: Skin is warm and dry.  Psychiatric: Mood, memory, affect and judgment normal.    Lab Results  Component Value Date   WBC 8.1 10/03/2015   HCT 42.6 02/07/2016   PLT 202 10/03/2015   GLUCOSE 103 (H) 10/03/2015   CHOL 220 (H) 10/03/2015   TRIG 158 (H) 10/03/2015   HDL 45 10/03/2015   LDLCALC 143 (H) 10/03/2015   TSH 1.480 10/03/2015   HGBA1C 5.3 03/27/2015    CMP     Component Value Date/Time   NA 143 10/03/2015 0949   K 5.0 10/03/2015 0949   CL 103 10/03/2015 0949   CO2 25 10/03/2015 0949   GLUCOSE 103 (H) 10/03/2015 0949   BUN 17 10/03/2015 0949   CREATININE 1.09 10/03/2015 0949   CALCIUM 9.4 10/03/2015 0949   PROT 6.2 02/07/2016 0813   ALBUMIN 4.4 02/07/2016 0813   AST 39 02/07/2016 0813   ALT 52 (H) 02/07/2016 0813   ALKPHOS 87 02/07/2016 0813   BILITOT 0.8 02/07/2016 0813   GFRNONAA 75 10/03/2015 0949   GFRAA 87 10/03/2015 0949    Assessment and Plan :  1. Essential hypertension Good today, but has been running higher and with DOT CPE coming up will increase Metoprolol to 50 mg. Follow up in a couple months.  - metoprolol succinate (TOPROL-XL) 50 MG 24 hr tablet; Take 1 tablet (50 mg total) by mouth daily.  Dispense: 30  tablet; Refill: 12  2. Insomnia, unspecified type    HPI, Exam, and A&P Transcribed under the direction and in the presence of Jesper Stirewalt L. Cranford Mon, MD  Electronically Signed: Katina Dung, CMA I have done the exam and reviewed the above chart and it is accurate to the best of my knowledge. Development worker, community has been used in this note in any air is in the dictation or transcription are unintentional.  [facility] 03/11/2016 8:37 AM

## 2016-04-02 ENCOUNTER — Ambulatory Visit: Payer: BLUE CROSS/BLUE SHIELD | Admitting: Family Medicine

## 2016-05-04 ENCOUNTER — Other Ambulatory Visit: Payer: Self-pay | Admitting: Family Medicine

## 2016-05-14 ENCOUNTER — Encounter: Payer: Self-pay | Admitting: Family Medicine

## 2016-05-14 ENCOUNTER — Ambulatory Visit (INDEPENDENT_AMBULATORY_CARE_PROVIDER_SITE_OTHER): Payer: BLUE CROSS/BLUE SHIELD | Admitting: Family Medicine

## 2016-05-14 VITALS — BP 136/78 | HR 80 | Temp 98.7°F | Resp 16 | Wt 200.0 lb

## 2016-05-14 DIAGNOSIS — I10 Essential (primary) hypertension: Secondary | ICD-10-CM | POA: Diagnosis not present

## 2016-05-14 NOTE — Progress Notes (Signed)
         Patient: Christopher Rose Male    DOB: 23-Oct-1960   56 y.o.   MRN: [id number] Visit Date: 05/14/2016  Today's Provider: Wilhemena Durie, MD   Chief Complaint  Patient presents with  . Hypertension   Subjective:    HPI  Hypertension, follow-up:  BP Readings from Last 3 Encounters:  05/14/16 136/78  03/11/16 138/86  02/20/16 (!) 149/91    He was last seen for hypertension 2 months ago.  BP at that visit was 138/86. Management since that visit includes increasing Metoprolol to 50mg  daily. He reports good compliance with treatment. He is not having side effects.  He is not exercising. He is adherent to low salt diet.   Outside blood pressures are 130s/80s. He is experiencing none.  Patient denies none.    Weight trend: stable Wt Readings from Last 3 Encounters:  05/14/16 200 lb (90.7 kg)  03/11/16 202 lb (91.6 kg)  02/20/16 200 lb (90.7 kg)    Current diet: well balanced        Allergies  Allergen Reactions  . Penicillins   . Amoxicillin Rash     Current Outpatient Prescriptions:  .  clomiPHENE (CLOMID) 50 MG tablet, Take 1/2 tablet daily, Disp: 90 tablet, Rfl: 4 .  meloxicam (MOBIC) 15 MG tablet, TAKE 1 TABLET (15 MG TOTAL) BY MOUTH DAILY., Disp: 90 tablet, Rfl: 3 .  metoprolol succinate (TOPROL-XL) 50 MG 24 hr tablet, Take 1 tablet (50 mg total) by mouth daily., Disp: 30 tablet, Rfl: 12 .  tadalafil (CIALIS) 20 MG tablet, take 1 tablet by mouth if needed, Disp: 6 tablet, Rfl: 12 .  zolpidem (AMBIEN) 10 MG tablet, TAKE 1 TABLET BY MOUTH AT BEDTIME AS NEEDED, Disp: 90 tablet, Rfl: 1  Review of Systems  Constitutional: Negative.   Respiratory: Negative.   Cardiovascular: Negative.  Negative for chest pain, palpitations and leg swelling.  Musculoskeletal: Negative.   Neurological: Negative.     Social History  Substance Use Topics  . Smoking status: Never Smoker  . Smokeless tobacco: Never Used  . Alcohol use 0.0 oz/week     Comment:  occasional - holidays   Objective:   BP 136/78 (BP Location: Right Arm, Patient Position: Sitting, Cuff Size: Normal)   Pulse 80   Temp 98.7 F (37.1 C)   Resp 16   Wt 200 lb (90.7 kg)   SpO2 98%   BMI 28.70 kg/m  Vitals:   05/14/16 1613  BP: 136/78  Pulse: 80  Resp: 16  Temp: 98.7 F (37.1 C)  SpO2: 98%  Weight: 200 lb (90.7 kg)     Physical Exam  Constitutional: He appears well-developed and well-nourished.  Cardiovascular: Normal rate, regular rhythm and normal heart sounds.   Pulmonary/Chest: Effort normal and breath sounds normal.  Skin: Skin is warm and dry.  Psychiatric: He has a normal mood and affect. His behavior is normal. Judgment and thought content normal.        Assessment & Plan:     1. Essential hypertension Stable on Metoprolol. F/U in 6 months for CPE.  2.SVT 3.BPH 4.ED      I have done the exam and reviewed the above chart and it is accurate to the best of my knowledge. Development worker, community has been used in this note in any air is in the dictation or transcription are unintentional.  Wilhemena Durie, MD  [place]

## 2016-06-16 LAB — HEPATIC FUNCTION PANEL
ALT: 60 — AB (ref 10–40)
AST: 45 — AB (ref 14–40)
Alkaline Phosphatase: 84 (ref 25–125)
BILIRUBIN, TOTAL: 0.8

## 2016-06-16 LAB — LIPID PANEL
CHOLESTEROL: 194 (ref 0–200)
HDL: 44 (ref 35–70)
LDL Cholesterol: 115
LDL/HDL RATIO: 4.4
TRIGLYCERIDES: 175 — AB (ref 40–160)

## 2016-06-16 LAB — BASIC METABOLIC PANEL
BUN: 19 (ref 4–21)
CREATININE: 1.2 (ref 0.6–1.3)
Glucose: 11
POTASSIUM: 4.8 (ref 3.4–5.3)
Sodium: 142 (ref 137–147)

## 2016-06-16 LAB — PSA: PSA: 1.1

## 2016-06-16 LAB — CBC AND DIFFERENTIAL
HCT: 42 (ref 41–53)
HEMOGLOBIN: 14.1 (ref 13.5–17.5)
Platelets: 187 (ref 150–399)
WBC: 7.7

## 2016-06-16 LAB — HEMOGLOBIN A1C: HEMOGLOBIN A1C: 5.6

## 2016-07-06 ENCOUNTER — Telehealth: Payer: Self-pay

## 2016-07-06 NOTE — Telephone Encounter (Signed)
Lmtcb, received lab results from patient's wellness program through work. Dr Rosanna Randy reviewed and said mild fatty liver, limit alcohol and tylenol intake. No other comments. Lab results abstracted in the chart and placed to be scanned-aa

## 2016-07-06 NOTE — Telephone Encounter (Signed)
Advised  ED 

## 2016-07-07 ENCOUNTER — Encounter: Payer: Self-pay | Admitting: Family Medicine

## 2016-08-17 ENCOUNTER — Other Ambulatory Visit: Payer: Self-pay

## 2016-08-17 DIAGNOSIS — E291 Testicular hypofunction: Secondary | ICD-10-CM

## 2016-08-17 DIAGNOSIS — N401 Enlarged prostate with lower urinary tract symptoms: Secondary | ICD-10-CM

## 2016-08-18 ENCOUNTER — Other Ambulatory Visit: Payer: BLUE CROSS/BLUE SHIELD

## 2016-08-18 DIAGNOSIS — N401 Enlarged prostate with lower urinary tract symptoms: Secondary | ICD-10-CM

## 2016-08-18 DIAGNOSIS — E291 Testicular hypofunction: Secondary | ICD-10-CM

## 2016-08-19 LAB — PSA: PROSTATE SPECIFIC AG, SERUM: 1 ng/mL (ref 0.0–4.0)

## 2016-08-19 LAB — HEPATIC FUNCTION PANEL
ALT: 51 IU/L — AB (ref 0–44)
AST: 38 IU/L (ref 0–40)
Albumin: 4.3 g/dL (ref 3.5–5.5)
Alkaline Phosphatase: 94 IU/L (ref 39–117)
Bilirubin Total: 0.7 mg/dL (ref 0.0–1.2)
Bilirubin, Direct: 0.31 mg/dL (ref 0.00–0.40)
Total Protein: 6 g/dL (ref 6.0–8.5)

## 2016-08-19 LAB — HEMATOCRIT: HEMATOCRIT: 40.3 % (ref 37.5–51.0)

## 2016-08-19 LAB — TESTOSTERONE: TESTOSTERONE: 666 ng/dL (ref 264–916)

## 2016-08-19 NOTE — Progress Notes (Signed)
08/20/2016 8:30 AM   [place] 07-19-1960 [id number]  Referring provider: Jerrol Banana., MD [address] [place], [place] [address]  Chief Complaint  Patient presents with  . Hypogonadism    6 month follow up    HPI: Patient is a 56 year old Caucasian male with testosterone deficiency, erectile dysfunction and BPH with LUTS who presents today for a 6 months follow up.    Testosterone deficiency Patient is experiencing a decrease in libido and a lack of energy.  This is indicated by his responses to the ADAM questionnaire.  His is still experiencing spontaneous erections.  He is currently managing his testosterone deficiency with Clomid 50 mg, 1/2 daily.  His most recent testosterone level was  666 ng/dL on 08/18/2016.        Androgen Deficiency in the Aging Male    Boyd Name 08/20/16 0800         Androgen Deficiency in the Aging Male   Do you have a decrease in libido (sex drive) Yes     Do you have lack of energy Yes     Do you have a decrease in strength and/or endurance No     Have you lost height No     Have you noticed a decreased "enjoyment of life" No     Are you sad and/or grumpy No     Are your erections less strong No     Have you noticed a recent deterioration in your ability to play sports No     Are you falling asleep after dinner No     Has there been a recent deterioration in your work performance No        Erectile dysfunction His SHIM score is 23, which is mild ED.   His previous SHIM was 24.  He has been having difficulty with erections for the last few years.   Previous score was 15.  His major complaint is maintaining an erection.  His libido is diminished.   His risk factors for ED are age, BPH, HTN and hypogonadism.  He denies any painful erections or curvatures with his erections.   He has tried Cialis 20 mg, 1/2 tablet in the past with adequate results.      SHIM    Row Name 08/20/16 0825         SHIM: Over the last  6 months:   How do you rate your confidence that you could get and keep an erection? Moderate     When you had erections with sexual stimulation, how often were your erections hard enough for penetration (entering your partner)? Almost Always or Always     During sexual intercourse, how often were you able to maintain your erection after you had penetrated (entered) your partner? Almost Always or Always     During sexual intercourse, how difficult was it to maintain your erection to completion of intercourse? Not Difficult     When you attempted sexual intercourse, how often was it satisfactory for you? Almost Always or Always       SHIM Total Score   SHIM 23        Score: 1-7 Severe ED 8-11 Moderate ED 12-16 Mild-Moderate ED 17-21 Mild ED 22-25 No ED   BPH WITH LUTS His IPSS score today is 2, which is mild lower urinary tract symptomatology. He is delighted with his quality life due to his urinary symptoms.  His previous IPSS score  was 5/0.  He denies any dysuria, hematuria or suprapubic pain.  He also denies any recent fevers, chills, nausea or vomiting.  He does not have a family history of PCa.      IPSS    Row Name 08/20/16 0800         International Prostate Symptom Score   How often have you had the sensation of not emptying your bladder? Not at All     How often have you had to urinate less than every two hours? Less than 1 in 5 times     How often have you found you stopped and started again several times when you urinated? Not at All     How often have you found it difficult to postpone urination? Not at All     How often have you had a weak urinary stream? Not at All     How often have you had to strain to start urination? Not at All     How many times did you typically get up at night to urinate? 1 Time     Total IPSS Score 2       Quality of Life due to urinary symptoms   If you were to spend the rest of your life with your urinary condition just the way it is  now how would you feel about that? Delighted        Score:  1-7 Mild 8-19 Moderate 20-35 Severe   PMH: Past Medical History:  Diagnosis Date  . Anal fissure   . Arthritis    knees  . BPH (benign prostatic hyperplasia)   . Chronic insomnia   . Chronic sinusitis   . ED (erectile dysfunction)   . HLD (hyperlipidemia)   . HTN (hypertension)   . Hypogonadism in male   . Measles   . Mumps   . Palpitations   . SVT (supraventricular tachycardia) [facility])     Surgical History: Past Surgical History:  Procedure Laterality Date  . cardiac ablation surgery  2010  . COLONOSCOPY WITH PROPOFOL N/A 12/02/2015   Procedure: COLONOSCOPY WITH PROPOFOL;  Surgeon: Lucilla Lame, MD;  Location: [place];  Service: Endoscopy;  Laterality: N/A;  . HERNIA REPAIR  1990  . POLYPECTOMY  12/02/2015   Procedure: POLYPECTOMY;  Surgeon: Lucilla Lame, MD;  Location: [place];  Service: Endoscopy;;  . VASECTOMY  1992    Home Medications:  Allergies as of 08/20/2016      Reactions   Penicillins    Amoxicillin Rash      Medication List       Accurate as of 08/20/16  8:30 AM. Always use your most recent med list.          clomiPHENE 50 MG tablet Commonly known as:  CLOMID Take 1/2 tablet daily   meloxicam 15 MG tablet Commonly known as:  MOBIC TAKE 1 TABLET (15 MG TOTAL) BY MOUTH DAILY.   metoprolol succinate 50 MG 24 hr tablet Commonly known as:  TOPROL-XL Take 1 tablet (50 mg total) by mouth daily.   tadalafil 20 MG tablet Commonly known as:  CIALIS take 1 tablet by mouth if needed   zolpidem 10 MG tablet Commonly known as:  AMBIEN TAKE 1 TABLET BY MOUTH AT BEDTIME AS NEEDED       Allergies:  Allergies  Allergen Reactions  . Penicillins   . Amoxicillin Rash    Family History: Family History  Problem Relation Age of Onset  .  Diabetes Mellitus II Mother   . Colon cancer Paternal Grandfather   . Heart attack Father   . Kidney disease Neg Hx   .  Prostate cancer Neg Hx   . Bladder Cancer Neg Hx   . Kidney cancer Neg Hx     Social History:  reports that he has never smoked. He has never used smokeless tobacco. He reports that he drinks alcohol. He reports that he does not use drugs.  ROS: UROLOGY Frequent Urination?: No Hard to postpone urination?: No Burning/pain with urination?: No Get up at night to urinate?: No Leakage of urine?: No Urine stream starts and stops?: No Trouble starting stream?: No Do you have to strain to urinate?: No Blood in urine?: No Urinary tract infection?: No Sexually transmitted disease?: No Injury to kidneys or bladder?: No Painful intercourse?: No Weak stream?: No Erection problems?: No Penile pain?: No  Gastrointestinal Nausea?: No Vomiting?: No Indigestion/heartburn?: No Diarrhea?: No Constipation?: No  Constitutional Fever: No Night sweats?: No Weight loss?: No Fatigue?: No  Skin Skin rash/lesions?: No Itching?: No  Eyes Blurred vision?: No Double vision?: No  Ears/Nose/Throat Sore throat?: No Sinus problems?: No  Hematologic/Lymphatic Swollen glands?: No Easy bruising?: No  Cardiovascular Leg swelling?: No Chest pain?: No  Respiratory Cough?: No Shortness of breath?: No  Endocrine Excessive thirst?: No  Musculoskeletal Back pain?: No Joint pain?: No  Neurological Headaches?: No Dizziness?: No  Psychologic Depression?: No Anxiety?: No  Physical Exam: BP (!) 154/96   Pulse 73   Ht 5\' 9"  (1.753 m)   Wt 198 lb 12.8 oz (90.2 kg)   BMI 29.36 kg/m   Constitutional: Well nourished. Alert and oriented, No acute distress. HEENT: [place] AT, moist mucus membranes. Trachea midline, no masses. Cardiovascular: No clubbing, cyanosis, or edema. Respiratory: Normal respiratory effort, no increased work of breathing. GI: Abdomen is soft, non tender, non distended, no abdominal masses. Liver and spleen not palpable.  No hernias appreciated.  Stool sample for  occult testing is not indicated.   GU: No CVA tenderness.  No bladder fullness or masses.  Patient with circumcised phallus.   Urethral meatus is patent.  No penile discharge. No penile lesions or rashes. Scrotum without lesions, cysts, rashes and/or edema.  Testicles are located scrotally bilaterally. No masses are appreciated in the testicles. Left and right epididymis are normal. Rectal: Patient with  normal sphincter tone. Anus and perineum without scarring or rashes. No rectal masses are appreciated. Prostate is approximately 50 grams, no nodules are appreciated. Seminal vesicles are normal. Skin: No rashes, bruises or suspicious lesions. Lymph: No cervical or inguinal adenopathy. Neurologic: Grossly intact, no focal deficits, moving all 4 extremities. Psychiatric: Normal mood and affect.  Laboratory Data: Results for orders placed or performed in visit on 08/18/16  Hepatic function panel  Result Value Ref Range   Total Protein 6.0 6.0 - 8.5 g/dL   Albumin 4.3 3.5 - 5.5 g/dL   Bilirubin Total 0.7 0.0 - 1.2 mg/dL   Bilirubin, Direct 0.31 0.00 - 0.40 mg/dL   Alkaline Phosphatase 94 39 - 117 IU/L   AST 38 0 - 40 IU/L   ALT 51 (H) 0 - 44 IU/L  Hematocrit  Result Value Ref Range   Hematocrit 40.3 37.5 - 51.0 %  PSA  Result Value Ref Range   Prostate Specific Ag, Serum 1.0 0.0 - 4.0 ng/mL  Testosterone  Result Value Ref Range   Testosterone 666 264 - 916 ng/dL   Lab Results  Component Value Date   WBC 7.7 06/16/2016   HGB 14.1 06/16/2016   HCT 40.3 08/18/2016   MCV 89 10/03/2015   PLT 187 06/16/2016    Lab Results  Component Value Date   CREATININE 1.2 06/16/2016    Lab Results  Component Value Date   TSH 1.480 10/03/2015   Lipid Panel     Component Value Date/Time   CHOL 194 06/16/2016   CHOL 220 (H) 10/03/2015 0949   TRIG 175 (A) 06/16/2016   HDL 44 06/16/2016   HDL 45 10/03/2015 0949   LDLCALC 115 06/16/2016   LDLCALC 143 (H) 10/03/2015 0949    Lab Results   Component Value Date   AST 38 08/18/2016   Lab Results  Component Value Date   ALT 51 (H) 08/18/2016   I reviewed the labs  Assessment & Plan:    1. Testosterone deficiency:     -most recent testosterone level is 666 ng/dL on 08/18/2016  -continue Clomid 50 mg, 1/2 tablet daily; refills given  -RTC in 6 months for HCT/HBG, testosterone, PSA, LFT's, ADAM and exam  2. BPH with LUTS  - IPSS score is 2/0, it is improving  - Continue conservative management, avoiding bladder irritants and timed voiding's  - RTC in 6 months for IPSS, PSA and exam, as testosterone therapy can cause prostate enlargement and worsen LUTS  3. Erectile dysfunction:     -SHIM score is 23  -continue  Cialis  -RTC in 6 months for SHIM score and exam, as testosterone therapy can affect erections   Return in about 6 months (around 02/20/2017) for ADAM, IPSS, SHIM and exam, LFTs, PSA,Testoterone and  HCT/HBG.  These notes generated with voice recognition software. I apologize for typographical errors.  Zara Council, [facility] [address], [place], [place] [address] [phone]

## 2016-08-20 ENCOUNTER — Encounter: Payer: Self-pay | Admitting: Urology

## 2016-08-20 ENCOUNTER — Ambulatory Visit: Payer: BLUE CROSS/BLUE SHIELD | Admitting: Urology

## 2016-08-20 VITALS — BP 154/96 | HR 73 | Ht 69.0 in | Wt 198.8 lb

## 2016-08-20 DIAGNOSIS — N529 Male erectile dysfunction, unspecified: Secondary | ICD-10-CM | POA: Diagnosis not present

## 2016-08-20 DIAGNOSIS — N401 Enlarged prostate with lower urinary tract symptoms: Secondary | ICD-10-CM | POA: Diagnosis not present

## 2016-08-20 DIAGNOSIS — N138 Other obstructive and reflux uropathy: Secondary | ICD-10-CM | POA: Diagnosis not present

## 2016-08-20 DIAGNOSIS — E349 Endocrine disorder, unspecified: Secondary | ICD-10-CM | POA: Diagnosis not present

## 2016-10-02 ENCOUNTER — Encounter: Payer: Self-pay | Admitting: Family Medicine

## 2016-10-02 ENCOUNTER — Ambulatory Visit (INDEPENDENT_AMBULATORY_CARE_PROVIDER_SITE_OTHER): Payer: BLUE CROSS/BLUE SHIELD | Admitting: Family Medicine

## 2016-10-02 ENCOUNTER — Ambulatory Visit
Admission: RE | Admit: 2016-10-02 | Discharge: 2016-10-02 | Disposition: A | Discharge status: Home / Self Care | Payer: BLUE CROSS/BLUE SHIELD | Source: Ambulatory Visit | Attending: Family Medicine | Admitting: Family Medicine

## 2016-10-02 VITALS — BP 120/78 | HR 55 | Temp 97.8°F | Resp 16 | Wt 199.0 lb

## 2016-10-02 DIAGNOSIS — M545 Low back pain, unspecified: Secondary | ICD-10-CM

## 2016-10-02 DIAGNOSIS — I700 Atherosclerosis of aorta: Secondary | ICD-10-CM | POA: Diagnosis not present

## 2016-10-02 DIAGNOSIS — M47897 Other spondylosis, lumbosacral region: Secondary | ICD-10-CM | POA: Insufficient documentation

## 2016-10-02 MED ORDER — NAPROXEN 500 MG PO TABS: 500.0000 mg | ORAL_TABLET | Freq: Two times a day (BID) | ORAL | 0 refills | Status: AC

## 2016-10-02 NOTE — Patient Instructions (Signed)
Go to the [facility] on [facility] for back and hip Xray

## 2016-10-02 NOTE — Progress Notes (Signed)
Patient: Christopher Rose Male    DOB: 1960/12/13   56 y.o.   MRN: [id number] Visit Date: 10/02/2016  Today's Provider: Lelon Huh, MD   Chief Complaint  Patient presents with  . Back Pain   Subjective:    Patient has had lower right sided back pain for 1 week. Patient states he was sitting at all day at work a week ago and his back started hurting that evening. Patient stated the pain in his lower back does not radiate. Intermittent. This morning he woke up with the pain worse. Patient has no other symptoms. He has been taking otc Aleve with mild relief.    Back Pain  This is a new problem. The current episode started in the past 7 days. The problem occurs constantly. The problem is unchanged. The pain is present in the lumbar spine. The quality of the pain is described as stabbing. The pain does not radiate. The pain is at a severity of 7/10. The pain is moderate. The pain is the same all the time. The symptoms are aggravated by sitting. Stiffness is present all day. Pertinent negatives include no abdominal pain, bladder incontinence, bowel incontinence, chest pain, dysuria, fever, headaches, leg pain, numbness, paresis, paresthesias, pelvic pain, perianal numbness, tingling, weakness or weight loss. He has tried NSAIDs (Aleve) for the symptoms. The treatment provided mild relief.       Allergies  Allergen Reactions  . Penicillins   . Amoxicillin Rash     Current Outpatient Prescriptions:  .  clomiPHENE (CLOMID) 50 MG tablet, Take 1/2 tablet daily, Disp: 90 tablet, Rfl: 4 .  meloxicam (MOBIC) 15 MG tablet, TAKE 1 TABLET (15 MG TOTAL) BY MOUTH DAILY., Disp: 90 tablet, Rfl: 3 .  metoprolol succinate (TOPROL-XL) 50 MG 24 hr tablet, Take 1 tablet (50 mg total) by mouth daily., Disp: 30 tablet, Rfl: 12 .  tadalafil (CIALIS) 20 MG tablet, take 1 tablet by mouth if needed, Disp: 6 tablet, Rfl: 12 .  zolpidem (AMBIEN) 10 MG tablet, TAKE 1 TABLET BY MOUTH AT BEDTIME AS NEEDED, Disp:  90 tablet, Rfl: 1  Review of Systems  Constitutional: Negative for fever and weight loss.  Cardiovascular: Negative for chest pain.  Gastrointestinal: Negative for abdominal pain and bowel incontinence.  Genitourinary: Negative for bladder incontinence, dysuria and pelvic pain.  Musculoskeletal: Positive for back pain.  Neurological: Negative for tingling, weakness, numbness, headaches and paresthesias.    Social History  Substance Use Topics  . Smoking status: Never Smoker  . Smokeless tobacco: Never Used  . Alcohol use 0.0 oz/week     Comment: occasional - holidays   Objective:   BP 120/78 (BP Location: Right Arm, Patient Position: Sitting, Cuff Size: Large)   Pulse (!) 55   Temp 97.8 F (36.6 C) (Oral)   Resp 16   Wt 199 lb (90.3 kg)   SpO2 97%   BMI 29.39 kg/m  Vitals:   10/02/16 1127  BP: 120/78  Pulse: (!) 55  Resp: 16  Temp: 97.8 F (36.6 C)  TempSrc: Oral  SpO2: 97%  Weight: 199 lb (90.3 kg)     Physical Exam   General Appearance:    Alert, cooperative, no distress  Eyes:    PERRL, conjunctiva/corneas clear, EOM's intact       Lungs:     Clear to auscultation bilaterally, respirations unlabored  MS:  Moderate tenderness right lower back and over lumbar spine. No gross deformities  Assessment & Plan:     1. Acute right-sided low back pain without sciatica  - DG HIP UNILAT W OR W/O PELVIS 2-3 VIEWS RIGHT; Future - DG Lumbar Spine Complete; Future - naproxen (NAPROSYN) 500 MG tablet; Take 1 tablet (500 mg total) by mouth 2 (two) times daily with a meal.  Dispense: 20 tablet; Refill: 0       Lelon Huh, MD  [facility]

## 2016-10-05 ENCOUNTER — Telehealth: Payer: Self-pay

## 2016-10-05 NOTE — Telephone Encounter (Signed)
Pt.Advised. KW 

## 2016-10-05 NOTE — Telephone Encounter (Signed)
-----   Message from Birdie Sons, MD sent at 10/05/2016  7:55 AM EDT ----- Some arthritis in hip, but otherwise normal. Continue naprosyn prescribed last week. Call if not much improved in 7-10 days.

## 2016-11-09 ENCOUNTER — Ambulatory Visit: Payer: BLUE CROSS/BLUE SHIELD | Admitting: Family Medicine

## 2016-11-09 ENCOUNTER — Encounter: Payer: Self-pay | Admitting: Family Medicine

## 2016-11-09 ENCOUNTER — Other Ambulatory Visit: Payer: Self-pay | Admitting: Family Medicine

## 2016-11-09 VITALS — BP 138/84 | HR 75 | Temp 98.5°F | Wt 200.0 lb

## 2016-11-09 DIAGNOSIS — B9789 Other viral agents as the cause of diseases classified elsewhere: Secondary | ICD-10-CM | POA: Diagnosis not present

## 2016-11-09 DIAGNOSIS — J069 Acute upper respiratory infection, unspecified: Secondary | ICD-10-CM | POA: Diagnosis not present

## 2016-11-09 MED ORDER — PSEUDOEPH-BROMPHEN-DM 30-2-10 MG/5ML PO SYRP: 5.0000 mL | ORAL_SOLUTION | Freq: Four times a day (QID) | ORAL | 0 refills | Status: DC | PRN

## 2016-11-09 NOTE — Progress Notes (Signed)
Patient: Christopher Rose Male    DOB: 1960-08-25   56 y.o.   MRN: [id number] Visit Date: 11/09/2016  Today's Provider: Vernie Murders, PA   Chief Complaint  Patient presents with  . URI   Subjective:    URI   This is a new problem. Episode onset: Friday. The problem has been gradually worsening. There has been no fever. Associated symptoms include congestion, coughing and a sore throat. Associated symptoms comments: Voice change . Treatments tried: OTC cold medications. The treatment provided no relief.   Past Medical History:  Diagnosis Date  . Anal fissure   . Arthritis    knees  . BPH (benign prostatic hyperplasia)   . Chronic insomnia   . Chronic sinusitis   . ED (erectile dysfunction)   . HLD (hyperlipidemia)   . HTN (hypertension)   . Hypogonadism in male   . Measles   . Mumps   . Palpitations   . SVT (supraventricular tachycardia) (HCC)    Past Surgical History:  Procedure Laterality Date  . cardiac ablation surgery  2010  . HERNIA REPAIR  1990  . VASECTOMY  1992   Family History  Problem Relation Age of Onset  . Diabetes Mellitus II Mother   . Colon cancer Paternal Grandfather   . Heart attack Father   . Kidney disease Neg Hx   . Prostate cancer Neg Hx   . Bladder Cancer Neg Hx   . Kidney cancer Neg Hx    Allergies  Allergen Reactions  . Penicillins   . Amoxicillin Rash    Current Outpatient Medications:  .  clomiPHENE (CLOMID) 50 MG tablet, Take 1/2 tablet daily, Disp: 90 tablet, Rfl: 4 .  meloxicam (MOBIC) 15 MG tablet, TAKE 1 TABLET (15 MG TOTAL) BY MOUTH DAILY., Disp: 90 tablet, Rfl: 3 .  metoprolol succinate (TOPROL-XL) 50 MG 24 hr tablet, Take 1 tablet (50 mg total) by mouth daily., Disp: 30 tablet, Rfl: 12 .  tadalafil (CIALIS) 20 MG tablet, take 1 tablet by mouth if needed, Disp: 6 tablet, Rfl: 12 .  zolpidem (AMBIEN) 10 MG tablet, TAKE 1 TABLET BY MOUTH AT BEDTIME AS NEEDED, Disp: 90 tablet, Rfl: 1  Review of Systems  Constitutional:  Negative.   HENT: Positive for congestion and sore throat.   Respiratory: Positive for cough.   Cardiovascular: Negative.     Social History   Tobacco Use  . Smoking status: Never Smoker  . Smokeless tobacco: Never Used  Substance Use Topics  . Alcohol use: Yes    Alcohol/week: 0.0 oz    Comment: occasional - holidays   Objective:   BP 138/84 (BP Location: Right Arm, Patient Position: Sitting, Cuff Size: Normal)   Pulse 75   Temp 98.5 F (36.9 C) (Oral)   Wt 200 lb (90.7 kg)   SpO2 96%   BMI 29.53 kg/m  Vitals:   11/09/16 1036  BP: 138/84  Pulse: 75  Temp: 98.5 F (36.9 C)  TempSrc: Oral  SpO2: 96%  Weight: 200 lb (90.7 kg)   Physical Exam  Constitutional: He appears well-developed and well-nourished.  HENT:  Head: Normocephalic.  Right Ear: External ear normal.  Left Ear: External ear normal.  Nose: Nose normal.  Mouth/Throat: Oropharynx is clear and moist.  No sinus tenderness with good transillumination.   Eyes: Conjunctivae are normal.  Neck: Neck supple.  Cardiovascular: Normal rate and regular rhythm.  Pulmonary/Chest: Effort normal and breath sounds normal.  Abdominal: Soft.  Lymphadenopathy:    He has no cervical adenopathy.  Neurological: He is alert.  Psychiatric: He has a normal mood and affect. His behavior is normal.      Assessment & Plan:     1. Viral URI with cough Onset 3 days ago. No fever documented. Some scratchy sore throat and congestion with cough. No purulent sputum production. May use Tylenol prn aches, pains or fever. Increase fluid intake and add Bromfed-DM for cough and congestion. Recheck prn. - brompheniramine-pseudoephedrine-DM 30-2-10 MG/5ML syrup; Take 5 mLs 4 (four) times daily as needed by mouth.  Dispense: 120 mL; Refill: 0     Vernie Murders, PA  [facility]

## 2016-11-17 ENCOUNTER — Other Ambulatory Visit: Payer: Self-pay

## 2016-11-17 ENCOUNTER — Ambulatory Visit (INDEPENDENT_AMBULATORY_CARE_PROVIDER_SITE_OTHER): Payer: BLUE CROSS/BLUE SHIELD | Admitting: Family Medicine

## 2016-11-17 ENCOUNTER — Encounter: Payer: Self-pay | Admitting: Family Medicine

## 2016-11-17 VITALS — BP 134/76 | HR 68 | Temp 97.6°F | Resp 16 | Ht 69.0 in | Wt 197.0 lb

## 2016-11-17 DIAGNOSIS — Z1211 Encounter for screening for malignant neoplasm of colon: Secondary | ICD-10-CM

## 2016-11-17 DIAGNOSIS — Z0000 Encounter for general adult medical examination without abnormal findings: Secondary | ICD-10-CM

## 2016-11-17 LAB — POCT URINALYSIS DIPSTICK
Bilirubin, UA: NEGATIVE
Glucose, UA: NEGATIVE
Ketones, UA: NEGATIVE
LEUKOCYTES UA: NEGATIVE
NITRITE UA: NEGATIVE
PH UA: 5 (ref 5.0–8.0)
Protein, UA: NEGATIVE
RBC UA: NEGATIVE
Spec Grav, UA: 1.01 (ref 1.010–1.025)
UROBILINOGEN UA: NEGATIVE U/dL — AB

## 2016-11-17 NOTE — Progress Notes (Signed)
Patient: Christopher Rose, Male    DOB: 07/14/1960, 56 y.o.   MRN: [id number] Visit Date: 11/17/2016  Today's Provider: Wilhemena Durie, MD   Chief Complaint  Patient presents with  . Annual Exam   Subjective:  TWAIN STENSETH is a 56 y.o. male who presents today for health maintenance and complete physical. He feels well. He reports exercising none. He reports he is sleeping well.  Immunization History  Administered Date(s) Administered  . Influenza,inj,Quad PF,6+ Mos 10/03/2015  . Influenza-Unspecified 10/22/2016  . Tdap 08/11/2012   12/02/15 Colonoscopy, Dr. Sindy Guadeloupe hemorrhoids, tubular adenoma, repeat 5 years.  08/18/16 Testosterone, PSA, Hepatic, Hematocrit 06/16/16 PSA, A1C, Hepatic, Lipid, Met B, CBC  Review of Systems  Constitutional: Negative.   HENT: Negative.   Eyes: Negative.   Respiratory: Positive for cough.   Cardiovascular: Negative.   Gastrointestinal: Negative.   Endocrine: Negative.   Genitourinary: Negative.   Musculoskeletal: Negative.   Skin: Negative.   Allergic/Immunologic: Negative.   Neurological: Negative.   Hematological: Negative.   Psychiatric/Behavioral: Negative.     Social History   Socioeconomic History  . Marital status: Married    Spouse name: Not on file  . Number of children: Not on file  . Years of education: Not on file  . Highest education level: Not on file  Social Needs  . Financial resource strain: Not on file  . Food insecurity - worry: Not on file  . Food insecurity - inability: Not on file  . Transportation needs - medical: Not on file  . Transportation needs - non-medical: Not on file  Occupational History  . Not on file  Tobacco Use  . Smoking status: Never Smoker  . Smokeless tobacco: Never Used  Substance and Sexual Activity  . Alcohol use: Yes    Alcohol/week: 0.0 oz    Comment: occasional - holidays  . Drug use: No  . Sexual activity: Yes  Other Topics Concern  . Not on file  Social History Narrative   . Not on file    Patient Active Problem List   Diagnosis Date Noted  . Hx of colonic polyps   . Benign neoplasm of descending colon   . Insomnia 03/27/2015  . Erectile dysfunction of organic origin 02/13/2015  . BPH with obstruction/lower urinary tract symptoms 02/13/2015  . Hypogonadism in male 11/12/2014  . Insomnia, persistent 09/20/2014  . ED (erectile dysfunction) of organic origin 09/20/2014  . History of prolonged Q-T interval on ECG 09/20/2014  . Internal hemorrhoids 09/20/2014  . Hypercholesteremia 09/20/2014  . BP (high blood pressure) 09/20/2014  . 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 SVT (supraventricular tachycardia) ([facility]) 06/26/2008  . DIZZINESS 06/26/2008    Past Surgical History:  Procedure Laterality Date  . cardiac ablation surgery  2010  . HERNIA REPAIR  1990  . VASECTOMY  1992    His family history includes Colon cancer in his paternal grandfather; Diabetes Mellitus II in his mother; Heart attack in his father.     Outpatient Encounter Medications as of 11/17/2016  Medication Sig  . clomiPHENE (CLOMID) 50 MG tablet Take 1/2 tablet daily  . meloxicam (MOBIC) 15 MG tablet TAKE 1 TABLET (15 MG TOTAL) BY MOUTH DAILY.  . metoprolol succinate (TOPROL-XL) 50 MG 24 hr tablet Take 1 tablet (50 mg total) by mouth daily.  . tadalafil (CIALIS) 20 MG tablet take 1 tablet by mouth if needed  . zolpidem (AMBIEN) 10 MG tablet TAKE 1 TABLET BY MOUTH AT BEDTIME  . [DISCONTINUED]  brompheniramine-pseudoephedrine-DM 30-2-10 MG/5ML syrup Take 5 mLs 4 (four) times daily as needed by mouth.   No facility-administered encounter medications on file as of 11/17/2016.     Patient Care Team: Jerrol Banana., MD as PCP - General (Family Medicine)      Objective:   Vitals:  Vitals:   11/17/16 0901  BP: 134/76  Pulse: 68  Resp: 16  Temp: 97.6 F (36.4 C)  TempSrc: Oral  Weight: 197 lb (89.4 kg)  Height: '5\' 9"'$  (1.753 m)    Physical Exam  Constitutional: He is oriented  to person, place, and time. He appears well-developed and well-nourished.  HENT:  Head: Normocephalic and atraumatic.  Right Ear: External ear normal.  Left Ear: External ear normal.  Nose: Nose normal.  Mouth/Throat: Oropharynx is clear and moist.  Eyes: Conjunctivae and EOM are normal. Pupils are equal, round, and reactive to light.  Neck: Normal range of motion. Neck supple.  Cardiovascular: Normal rate, regular rhythm, normal heart sounds and intact distal pulses.  Pulmonary/Chest: Effort normal and breath sounds normal.  Abdominal: Soft. Bowel sounds are normal.  Genitourinary: Penis normal.  Musculoskeletal: Normal range of motion.  Neurological: He is alert and oriented to person, place, and time. He has normal reflexes.  Skin: Skin is warm and dry.  Psychiatric: He has a normal mood and affect. His behavior is normal. Judgment and thought content normal.   Fall Risk  11/17/2016 10/03/2015 09/20/2014  Falls in the past year? No No No   Current Exercise Habits: The patient does not participate in regular exercise at present   Functional Status Survey: Is the patient deaf or have difficulty hearing?: No Does the patient have difficulty seeing, even when wearing glasses/contacts?: Yes Does the patient have difficulty concentrating, remembering, or making decisions?: No Does the patient have difficulty walking or climbing stairs?: No Does the patient have difficulty dressing or bathing?: No Does the patient have difficulty doing errands alone such as visiting a doctor's office or shopping?: No  Depression Screen PHQ 2/9 Scores 11/17/2016 10/03/2015 09/20/2014  PHQ - 2 Score 0 0 0  PHQ- 9 Score 2 - -      Assessment & Plan:     Routine Health Maintenance and Physical Exam  Exercise Activities and Dietary recommendations Goals    None      Immunization History  Administered Date(s) Administered  . Influenza,inj,Quad PF,6+ Mos 10/03/2015  . Influenza-Unspecified  10/22/2016  . Tdap 08/11/2012    Health Maintenance  Topic Date Due  . HIV Screening  07/27/1974  . COLONOSCOPY  12/01/2020  . TETANUS/TDAP  08/12/2022  . INFLUENZA VACCINE  Completed  . Hepatitis C Screening  Completed   1. Annual physical exam   2. Colon cancer screening Patient is given [facility] Lyte for home collection with instructions to return specimen to office after obtained.   Discussed health benefits of physical activity, and encouraged him to engage in regular exercise appropriate for his age and condition.     HPI, Exam and A&P Transcribed under the direction and in the presence of Wilhemena Durie., MD. Electronically Signed: Althea Charon, [place]

## 2016-11-17 NOTE — Patient Instructions (Addendum)
Follow directions for stool collection.  Return specimen in bag, to the office after specimen is obtained.

## 2016-12-04 ENCOUNTER — Other Ambulatory Visit: Payer: Self-pay

## 2016-12-04 DIAGNOSIS — Z1211 Encounter for screening for malignant neoplasm of colon: Secondary | ICD-10-CM

## 2016-12-04 LAB — IFOBT (OCCULT BLOOD): IFOBT: POSITIVE

## 2016-12-04 NOTE — Progress Notes (Unsigned)
Patient brought in [name] we gave him to bring home.  Results were positive. Christopher Rose

## 2016-12-07 NOTE — Progress Notes (Unsigned)
Patient brought in home OC Lyte test last week that was positive.  I have called the patient to discuss results with him. He has a history of internal hemorrhoids and had his last colonoscopy in 2017.  Per Dr Rosanna Randy we will have patient come in for anoscopy in January unless he has seen any blood with bowel movements.    [facility]  ED

## 2016-12-08 ENCOUNTER — Telehealth: Payer: Self-pay | Admitting: Family Medicine

## 2016-12-08 NOTE — Progress Notes (Signed)
After further discussion with Dr Rosanna Randy, decision was made to have this followed on next visit with anascope due to the patient having known internal hemorrhoids.

## 2016-12-08 NOTE — Progress Notes (Signed)
Patient advised and he wishes to wait until his next visit to have this checked. ED

## 2016-12-08 NOTE — Telephone Encounter (Signed)
Pt returning call from Barry regarding test results.  Please call the pt back

## 2017-02-01 ENCOUNTER — Other Ambulatory Visit: Payer: Self-pay | Admitting: Family Medicine

## 2017-02-01 DIAGNOSIS — I10 Essential (primary) hypertension: Secondary | ICD-10-CM

## 2017-02-01 MED ORDER — METOPROLOL SUCCINATE ER 50 MG PO TB24: 50.0000 mg | ORAL_TABLET | Freq: Every day | ORAL | 12 refills | Status: DC

## 2017-02-01 NOTE — Telephone Encounter (Signed)
Pharmacy is requesting refill on  metoprolol succinate (TOPROL-XL) 50 MG 24 hr tablet    [facility] [address]

## 2017-02-03 ENCOUNTER — Other Ambulatory Visit: Payer: Self-pay | Admitting: Family Medicine

## 2017-02-03 MED ORDER — MELOXICAM 15 MG PO TABS: 15.0000 mg | ORAL_TABLET | Freq: Every day | ORAL | 3 refills | Status: DC

## 2017-02-03 NOTE — Telephone Encounter (Signed)
[facility] for a 90-days supply for the following medication. Thanks CC  meloxicam (MOBIC) 15 MG tablet

## 2017-02-03 NOTE — Telephone Encounter (Signed)
Pharmacy requesting refills. Thanks!  

## 2017-02-08 ENCOUNTER — Other Ambulatory Visit: Payer: Self-pay

## 2017-02-08 DIAGNOSIS — E291 Testicular hypofunction: Secondary | ICD-10-CM

## 2017-02-09 ENCOUNTER — Other Ambulatory Visit: Payer: BLUE CROSS/BLUE SHIELD

## 2017-02-09 DIAGNOSIS — E291 Testicular hypofunction: Secondary | ICD-10-CM | POA: Diagnosis not present

## 2017-02-10 LAB — PSA: PROSTATE SPECIFIC AG, SERUM: 1.3 ng/mL (ref 0.0–4.0)

## 2017-02-10 LAB — HEPATIC FUNCTION PANEL
ALT: 36 IU/L (ref 0–44)
AST: 32 IU/L (ref 0–40)
Albumin: 4.5 g/dL (ref 3.5–5.5)
Alkaline Phosphatase: 91 IU/L (ref 39–117)
Bilirubin Total: 0.5 mg/dL (ref 0.0–1.2)
Bilirubin, Direct: 0.22 mg/dL (ref 0.00–0.40)
Total Protein: 6.4 g/dL (ref 6.0–8.5)

## 2017-02-10 LAB — HEMATOCRIT: Hematocrit: 44.2 % (ref 37.5–51.0)

## 2017-02-10 LAB — TESTOSTERONE: TESTOSTERONE: 834 ng/dL (ref 264–916)

## 2017-02-10 LAB — HEMOGLOBIN: Hemoglobin: 14.4 g/dL (ref 13.0–17.7)

## 2017-02-15 NOTE — Progress Notes (Signed)
02/16/2017 8:44 AM   Christopher Rose December 19, 1960 [id number]  Referring provider: Jerrol Banana., MD [address] [place], [place] [address]  Chief Complaint  Patient presents with  . Hypogonadism    HPI: Patient is a 57 year old Caucasian male with testosterone deficiency, erectile dysfunction and BPH with LUTS who presents today for a 6 months follow up.    Testosterone deficiency He is currently managing his testosterone deficiency with Clomid 50 mg, 1/2 daily.  His most recent testosterone level was  834 ng/dL on 02/09/2017.  Hbg, HCT and LFT's are normal.       Erectile dysfunction His SHIM score is 24, which is no ED.   His previous SHIM was 23.   His risk factors for ED are age, BPH, HTN and hypogonadism.  He denies any painful erections or curvatures with his erections.   He has tried Cialis 20 mg, 1/2 tablet in the past with adequate results.  SHIM    Row Name 02/16/17 0837         SHIM: Over the last 6 months:   How do you rate your confidence that you could get and keep an erection?  High     When you had erections with sexual stimulation, how often were your erections hard enough for penetration (entering your partner)?  Almost Always or Always     During sexual intercourse, how often were you able to maintain your erection after you had penetrated (entered) your partner?  Almost Always or Always     During sexual intercourse, how difficult was it to maintain your erection to completion of intercourse?  Not Difficult     When you attempted sexual intercourse, how often was it satisfactory for you?  Almost Always or Always       SHIM Total Score   SHIM  24        Score: 1-7 Severe ED 8-11 Moderate ED 12-16 Mild-Moderate ED 17-21 Mild ED 22-25 No ED   BPH WITH LUTS His IPSS score today is 3, which is mild lower urinary tract symptomatology. He is pleased with his quality life due to his urinary symptoms.  His previous IPSS score was 2/0.   He denies any dysuria, hematuria or suprapubic pain.  He also denies any recent fevers, chills, nausea or vomiting.  He does not have a family history of PCa.  Current PSA is 1.3. IPSS    Row Name 02/16/17 0800         International Prostate Symptom Score   How often have you had the sensation of not emptying your bladder?  Less than 1 in 5     How often have you had to urinate less than every two hours?  Less than 1 in 5 times     How often have you found you stopped and started again several times when you urinated?  Not at All     How often have you found it difficult to postpone urination?  Not at All     How often have you had a weak urinary stream?  Not at All     How often have you had to strain to start urination?  Not at All     How many times did you typically get up at night to urinate?  1 Time     Total IPSS Score  3       Quality of Life due to urinary symptoms  If you were to spend the rest of your life with your urinary condition just the way it is now how would you feel about that?  Pleased        Score:  1-7 Mild 8-19 Moderate 20-35 Severe   PMH: Past Medical History:  Diagnosis Date  . Anal fissure   . Arthritis    knees  . BPH (benign prostatic hyperplasia)   . Chronic insomnia   . Chronic sinusitis   . ED (erectile dysfunction)   . HLD (hyperlipidemia)   . HTN (hypertension)   . Hypogonadism in male   . Measles   . Mumps   . Palpitations   . SVT (supraventricular tachycardia) [facility])     Surgical History: Past Surgical History:  Procedure Laterality Date  . cardiac ablation surgery  2010  . COLONOSCOPY WITH PROPOFOL N/A 12/02/2015   Procedure: COLONOSCOPY WITH PROPOFOL;  Surgeon: Lucilla Lame, MD;  Location: [facility];  Service: Endoscopy;  Laterality: N/A;  . HERNIA REPAIR  1990  . POLYPECTOMY  12/02/2015   Procedure: POLYPECTOMY;  Surgeon: Lucilla Lame, MD;  Location: [facility];  Service: Endoscopy;;  . VASECTOMY  1992     Home Medications:  Allergies as of 02/16/2017      Reactions   Penicillins    Amoxicillin Rash      Medication List        Accurate as of 02/16/17  8:44 AM. Always use your most recent med list.          clomiPHENE 50 MG tablet Commonly known as:  CLOMID Take 1/2 tablet daily   meloxicam 15 MG tablet Commonly known as:  MOBIC Take 1 tablet (15 mg total) by mouth daily.   metoprolol succinate 50 MG 24 hr tablet Commonly known as:  TOPROL-XL Take 1 tablet (50 mg total) by mouth daily.   tadalafil 20 MG tablet Commonly known as:  CIALIS take 1 tablet by mouth if needed   zolpidem 10 MG tablet Commonly known as:  AMBIEN TAKE 1 TABLET BY MOUTH AT BEDTIME       Allergies:  Allergies  Allergen Reactions  . Penicillins   . Amoxicillin Rash    Family History: Family History  Problem Relation Age of Onset  . Diabetes Mellitus II Mother   . Colon cancer Paternal Grandfather   . Heart attack Father   . Kidney disease Neg Hx   . Prostate cancer Neg Hx   . Bladder Cancer Neg Hx   . Kidney cancer Neg Hx     Social History:  reports that  has never smoked. he has never used smokeless tobacco. He reports that he drinks alcohol. He reports that he does not use drugs.  ROS: UROLOGY Frequent Urination?: No Hard to postpone urination?: No Burning/pain with urination?: No Get up at night to urinate?: No Leakage of urine?: No Urine stream starts and stops?: No Trouble starting stream?: No Do you have to strain to urinate?: No Blood in urine?: No Urinary tract infection?: No Sexually transmitted disease?: No Injury to kidneys or bladder?: No Painful intercourse?: No Weak stream?: No Erection problems?: No Penile pain?: No  Gastrointestinal Nausea?: No Vomiting?: No Indigestion/heartburn?: No Diarrhea?: No Constipation?: No  Constitutional Fever: No Night sweats?: No Weight loss?: No Fatigue?: No  Skin Skin rash/lesions?: No Itching?:  No  Eyes Blurred vision?: No Double vision?: No  Ears/Nose/Throat Sore throat?: No Sinus problems?: No  Hematologic/Lymphatic Swollen glands?: No Easy bruising?: No  Cardiovascular Leg swelling?: No Chest pain?: No  Respiratory Cough?: No Shortness of breath?: No  Endocrine Excessive thirst?: No  Musculoskeletal Back pain?: No Joint pain?: No  Neurological Headaches?: No Dizziness?: No  Psychologic Depression?: No Anxiety?: No  Physical Exam: BP 139/89   Pulse 74   Ht 5\' 9"  (1.753 m)   Wt 195 lb (88.5 kg)   BMI 28.80 kg/m   Constitutional: Well nourished. Alert and oriented, No acute distress. HEENT: [place] AT, moist mucus membranes. Trachea midline, no masses. Cardiovascular: No clubbing, cyanosis, or edema. Respiratory: Normal respiratory effort, no increased work of breathing. GI: Abdomen is soft, non tender, non distended, no abdominal masses. Liver and spleen not palpable.  No hernias appreciated.  Stool sample for occult testing is not indicated.   GU: No CVA tenderness.  No bladder fullness or masses.  Patient with circumcised phallus.   Urethral meatus is patent.  No penile discharge. No penile lesions or rashes. Scrotum without lesions, cysts, rashes and/or edema.  Testicles are located scrotally bilaterally. No masses are appreciated in the testicles. Left and right epididymis are normal. Rectal: Patient with  normal sphincter tone. Anus and perineum without scarring or rashes. No rectal masses are appreciated. Prostate is approximately 50 grams, no nodules are appreciated. Seminal vesicles are normal. Skin: No rashes, bruises or suspicious lesions. Lymph: No cervical or inguinal adenopathy. Neurologic: Grossly intact, no focal deficits, moving all 4 extremities. Psychiatric: Normal mood and affect.   Laboratory Data: Results for orders placed or performed in visit on 02/09/17  Testosterone  Result Value Ref Range   Testosterone 834 264 - 916 ng/dL   PSA  Result Value Ref Range   Prostate Specific Ag, Serum 1.3 0.0 - 4.0 ng/mL  Hematocrit  Result Value Ref Range   Hematocrit 44.2 37.5 - 51.0 %  Hemoglobin  Result Value Ref Range   Hemoglobin 14.4 13.0 - 17.7 g/dL  Hepatic function panel  Result Value Ref Range   Total Protein 6.4 6.0 - 8.5 g/dL   Albumin 4.5 3.5 - 5.5 g/dL   Bilirubin Total 0.5 0.0 - 1.2 mg/dL   Bilirubin, Direct 0.22 0.00 - 0.40 mg/dL   Alkaline Phosphatase 91 39 - 117 IU/L   AST 32 0 - 40 IU/L   ALT 36 0 - 44 IU/L   Lab Results  Component Value Date   WBC 7.7 06/16/2016   HGB 14.4 02/09/2017   HCT 44.2 02/09/2017   MCV 89 10/03/2015   PLT 187 06/16/2016    Lab Results  Component Value Date   CREATININE 1.2 06/16/2016    Lab Results  Component Value Date   TSH 1.480 10/03/2015   Lipid Panel     Component Value Date/Time   CHOL 194 06/16/2016   CHOL 220 (H) 10/03/2015 0949   TRIG 175 (A) 06/16/2016   HDL 44 06/16/2016   HDL 45 10/03/2015 0949   LDLCALC 115 06/16/2016   LDLCALC 143 (H) 10/03/2015 0949    Lab Results  Component Value Date   AST 32 02/09/2017   Lab Results  Component Value Date   ALT 36 02/09/2017   I reviewed the labs  Assessment & Plan:    1. Testosterone deficiency:     -most recent testosterone level is 834 ng/dL on 02/2017 (therapeutic levels 450-600)  -continue Clomid 50 mg, 1/2 tablet daily; refills given  -RTC in 6 months for HCT/HBG, testosterone, PSA, LFT's, ADAM and exam  2. BPH with LUTS  -  IPSS score is 3/0, it is slightly worse  - Continue conservative management, avoiding bladder irritants and timed voiding's  - RTC in 6 months for IPSS, PSA and exam, as testosterone therapy can cause prostate enlargement and worsen LUTS  3. Erectile dysfunction:     -SHIM score is 24, it is improving   -continue  Cialis  -RTC in 6 months for SHIM score and exam, as testosterone therapy can affect erections   Return in about 1 year (around 02/16/2018) for  PSA, LFT's, HCT, HBG, testosterone (before 10 AM), IPSS, SHIM and exam.  These notes generated with voice recognition software. I apologize for typographical errors.  Zara Council, [facility] [address], [place], [place] [address] [phone]

## 2017-02-16 ENCOUNTER — Ambulatory Visit: Payer: BLUE CROSS/BLUE SHIELD | Admitting: Urology

## 2017-02-16 ENCOUNTER — Encounter: Payer: Self-pay | Admitting: Urology

## 2017-02-16 VITALS — BP 139/89 | HR 74 | Ht 69.0 in | Wt 195.0 lb

## 2017-02-16 DIAGNOSIS — N529 Male erectile dysfunction, unspecified: Secondary | ICD-10-CM

## 2017-02-16 DIAGNOSIS — N138 Other obstructive and reflux uropathy: Secondary | ICD-10-CM | POA: Diagnosis not present

## 2017-02-16 DIAGNOSIS — N401 Enlarged prostate with lower urinary tract symptoms: Secondary | ICD-10-CM

## 2017-02-16 DIAGNOSIS — E349 Endocrine disorder, unspecified: Secondary | ICD-10-CM | POA: Diagnosis not present

## 2017-02-16 MED ORDER — CLOMIPHENE CITRATE 50 MG PO TABS: ORAL_TABLET | ORAL | 4 refills | Status: DC

## 2017-03-25 ENCOUNTER — Other Ambulatory Visit: Payer: Self-pay

## 2017-03-25 ENCOUNTER — Ambulatory Visit: Payer: BLUE CROSS/BLUE SHIELD | Admitting: Family Medicine

## 2017-03-25 VITALS — BP 132/88 | HR 74 | Temp 98.1°F | Resp 16 | Wt 198.0 lb

## 2017-03-25 DIAGNOSIS — S29019A Strain of muscle and tendon of unspecified wall of thorax, initial encounter: Secondary | ICD-10-CM

## 2017-03-25 MED ORDER — PREDNISONE 10 MG PO TABS: 10.0000 mg | ORAL_TABLET | Freq: Every day | ORAL | 0 refills | Status: DC

## 2017-03-25 MED ORDER — CYCLOBENZAPRINE HCL 10 MG PO TABS: 10.0000 mg | ORAL_TABLET | Freq: Three times a day (TID) | ORAL | 0 refills | Status: DC | PRN

## 2017-03-25 NOTE — Progress Notes (Signed)
Christopher Rose  MRN: [id number] DOB: [birth year]  Subjective:  HPI  The patient is a 57 year old male who presents for evaluation of his back pain.  He states that since playing golf 2 weekends ago he has been having pain between his shoulder blades that radiates down his spine and across his left side.  He states taht in the morning it is not too bad but by the end of the day he is in significant pain and as soon as he gets home he puts the heating pad on it and this seems to help some.  He is taking Meloxicam daily which he has been doing for 2 years now.    Patient Active Problem List   Diagnosis Date Noted  . Hx of colonic polyps   . Benign neoplasm of descending colon   . Insomnia 03/27/2015  . Erectile dysfunction of organic origin 02/13/2015  . BPH with obstruction/lower urinary tract symptoms 02/13/2015  . Hypogonadism in male 11/12/2014  . Insomnia, persistent 09/20/2014  . ED (erectile dysfunction) of organic origin 09/20/2014  . History of prolonged Q-T interval on ECG 09/20/2014  . Internal hemorrhoids 09/20/2014  . Hypercholesteremia 09/20/2014  . BP (high blood pressure) 09/20/2014  . 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 SVT (supraventricular tachycardia) ([facility]) 06/26/2008  . DIZZINESS 06/26/2008    Past Medical History:  Diagnosis Date  . Anal fissure   . Arthritis    knees  . BPH (benign prostatic hyperplasia)   . Chronic insomnia   . Chronic sinusitis   . ED (erectile dysfunction)   . HLD (hyperlipidemia)   . HTN (hypertension)   . Hypogonadism in male   . Measles   . Mumps   . Palpitations   . SVT (supraventricular tachycardia) ([facility])     Social History   Socioeconomic History  . Marital status: Married    Spouse name: Not on file  . Number of children: Not on file  . Years of education: Not on file  . Highest education level: Not on file  Occupational History  . Not on file  Social Needs  . Financial resource strain: Not on file  . Food insecurity:   Worry: Not on file    Inability: Not on file  . Transportation needs:    Medical: Not on file    Non-medical: Not on file  Tobacco Use  . Smoking status: Never Smoker  . Smokeless tobacco: Never Used  Substance and Sexual Activity  . Alcohol use: Yes    Alcohol/week: 0.0 oz    Comment: occasional - holidays  . Drug use: No  . Sexual activity: Yes  Lifestyle  . Physical activity:    Days per week: Not on file    Minutes per session: Not on file  . Stress: Not on file  Relationships  . Social connections:    Talks on phone: Not on file    Gets together: Not on file    Attends religious service: Not on file    Active member of club or organization: Not on file    Attends meetings of clubs or organizations: Not on file    Relationship status: Not on file  . Intimate partner violence:    Fear of current or ex partner: Not on file    Emotionally abused: Not on file    Physically abused: Not on file    Forced sexual activity: Not on file  Other Topics Concern  . Not on file  Social History Narrative  . Not on file    Outpatient Encounter Medications as of 03/25/2017  Medication Sig  . clomiPHENE (CLOMID) 50 MG tablet Take 1/2 tablet daily  . meloxicam (MOBIC) 15 MG tablet Take 1 tablet (15 mg total) by mouth daily.  . metoprolol succinate (TOPROL-XL) 50 MG 24 hr tablet Take 1 tablet (50 mg total) by mouth daily.  . tadalafil (CIALIS) 20 MG tablet take 1 tablet by mouth if needed  . zolpidem (AMBIEN) 10 MG tablet TAKE 1 TABLET BY MOUTH AT BEDTIME   No facility-administered encounter medications on file as of 03/25/2017.     Allergies  Allergen Reactions  . Penicillins   . Amoxicillin Rash    Review of Systems  Constitutional: Negative for fever and malaise/fatigue.  Respiratory: Negative for shortness of breath.   Cardiovascular: Negative for chest pain, palpitations, orthopnea and leg swelling.  Musculoskeletal: Positive for back pain (upper back), myalgias and neck  pain (feels like a knot at the base of his neck). Negative for falls and joint pain.  Neurological: Negative for dizziness, tingling and headaches.    Objective:  BP 132/88 (BP Location: Right Arm, Patient Position: Sitting, Cuff Size: Normal)   Pulse 74   Temp 98.1 F (36.7 C) (Oral)   Resp 16   Wt 198 lb (89.8 kg)   BMI 29.24 kg/m   Physical Exam  Constitutional: He is oriented to person, place, and time and well-developed, well-nourished, and in no distress.  HENT:  Head: Normocephalic and atraumatic.  Right Ear: External ear normal.  Left Ear: External ear normal.  Nose: Nose normal.  Mouth/Throat: Oropharynx is clear and moist.  Eyes: Conjunctivae are normal.  Neck: No thyromegaly present.  Cardiovascular: Normal rate, regular rhythm and normal heart sounds.  Pulmonary/Chest: Effort normal and breath sounds normal.  Abdominal: Soft.  Musculoskeletal: He exhibits no edema.  Mildly tender around left trapezius area.  Neurological: He is alert and oriented to person, place, and time. Gait normal. GCS score is 15.  Skin: Skin is warm and dry.  Psychiatric: Mood, memory, affect and judgment normal.    Assessment and Plan :  1. Thoracic myofascial strain, initial encounter May need PT referral if not improving. - predniSONE (DELTASONE) 10 MG tablet; Take 1 tablet (10 mg total) by mouth daily with breakfast. 6 D taper, start with 60 mg then decrease daily by 10 mg  Dispense: 21 tablet; Refill: 0 - cyclobenzaprine (FLEXERIL) 10 MG tablet; Take 1 tablet (10 mg total) by mouth 3 (three) times daily as needed for muscle spasms.  Dispense: 30 tablet; Refill: 0  I have done the exam and reviewed the chart and it is accurate to the best of my knowledge. Development worker, community has been used and  any errors in dictation or transcription are unintentional. Miguel Aschoff M.D. [facility]

## 2017-03-25 NOTE — Patient Instructions (Addendum)
Stop Meloxicam while on Prednisone, can resume after as needed.  If patient does not improve next week he will call and we can initiate physical therapy

## 2017-05-18 ENCOUNTER — Ambulatory Visit: Payer: Self-pay | Admitting: Family Medicine

## 2017-05-19 ENCOUNTER — Ambulatory Visit: Payer: BLUE CROSS/BLUE SHIELD | Admitting: Family Medicine

## 2017-05-19 ENCOUNTER — Encounter: Payer: Self-pay | Admitting: Family Medicine

## 2017-05-19 VITALS — BP 142/80 | HR 78 | Temp 98.4°F | Resp 16 | Wt 193.0 lb

## 2017-05-19 DIAGNOSIS — S29019A Strain of muscle and tendon of unspecified wall of thorax, initial encounter: Secondary | ICD-10-CM

## 2017-05-19 DIAGNOSIS — G4700 Insomnia, unspecified: Secondary | ICD-10-CM | POA: Diagnosis not present

## 2017-05-19 DIAGNOSIS — I10 Essential (primary) hypertension: Secondary | ICD-10-CM | POA: Diagnosis not present

## 2017-05-19 DIAGNOSIS — N529 Male erectile dysfunction, unspecified: Secondary | ICD-10-CM

## 2017-05-19 DIAGNOSIS — S29012A Strain of muscle and tendon of back wall of thorax, initial encounter: Secondary | ICD-10-CM | POA: Diagnosis not present

## 2017-05-19 MED ORDER — CYCLOBENZAPRINE HCL 10 MG PO TABS: 10.0000 mg | ORAL_TABLET | Freq: Three times a day (TID) | ORAL | 0 refills | Status: DC | PRN

## 2017-05-19 MED ORDER — LISINOPRIL 10 MG PO TABS: 10.0000 mg | ORAL_TABLET | Freq: Every day | ORAL | 3 refills | Status: DC

## 2017-05-19 MED ORDER — TADALAFIL 20 MG PO TABS: ORAL_TABLET | ORAL | 12 refills | Status: DC

## 2017-05-19 MED ORDER — ZOLPIDEM TARTRATE 10 MG PO TABS: 10.0000 mg | ORAL_TABLET | Freq: Every day | ORAL | 1 refills | Status: DC

## 2017-05-19 NOTE — Patient Instructions (Signed)
Added Lisinopril and Refilled Cialis, Ambien and Flexeril today.

## 2017-05-19 NOTE — Progress Notes (Signed)
Patient: Christopher Rose Male    DOB: September 02, 1960   57 y.o.   MRN: [id number] Visit Date: 05/19/2017  Today's Provider: Wilhemena Durie, MD   Chief Complaint  Patient presents with  . Hypertension   Subjective:    HPI  Hypertension, follow-up:  BP Readings from Last 3 Encounters:  05/19/17 (!) 142/80  03/25/17 132/88  02/16/17 139/89    He was last seen for hypertension 6 months ago.  BP at that visit was 132/88. Management since that visit includes none. He reports good compliance with treatment. He is not having side effects.  He is not exercising. He is adherent to low salt diet.   Outside blood pressures are being checked occasionally. Patient denies chest pain, chest pressure/discomfort, claudication, dyspnea, exertional chest pressure/discomfort, fatigue, irregular heart beat, lower extremity edema, near-syncope, orthopnea, palpitations, paroxysmal nocturnal dyspnea, syncope and tachypnea.    Wt Readings from Last 3 Encounters:  05/19/17 193 lb (87.5 kg)  03/25/17 198 lb (89.8 kg)  02/16/17 195 lb (88.5 kg)   ------------------------------------------------------------------------ Pt reports that he is still having problems with his left shoulder. It is not any better. He reports that it feels better in the mornings but by the afternoon the pain is back and if he lays down and rest it gets better. He has taken prednisone for it and it did help for a couple days then as it tapered the pain came back. He needs a refill on his Ambien and Cialis.      Allergies  Allergen Reactions  . Penicillins   . Amoxicillin Rash     Current Outpatient Medications:  .  clomiPHENE (CLOMID) 50 MG tablet, Take 1/2 tablet daily, Disp: 90 tablet, Rfl: 4 .  metoprolol succinate (TOPROL-XL) 50 MG 24 hr tablet, Take 1 tablet (50 mg total) by mouth daily., Disp: 30 tablet, Rfl: 12 .  tadalafil (CIALIS) 20 MG tablet, take 1 tablet by mouth if needed, Disp: 6 tablet, Rfl: 12 .   zolpidem (AMBIEN) 10 MG tablet, TAKE 1 TABLET BY MOUTH AT BEDTIME, Disp: 90 tablet, Rfl: 1 .  cyclobenzaprine (FLEXERIL) 10 MG tablet, Take 1 tablet (10 mg total) by mouth 3 (three) times daily as needed for muscle spasms. (Patient not taking: Reported on 05/19/2017), Disp: 30 tablet, Rfl: 0 .  meloxicam (MOBIC) 15 MG tablet, Take 1 tablet (15 mg total) by mouth daily. (Patient not taking: Reported on 05/19/2017), Disp: 90 tablet, Rfl: 3 .  predniSONE (DELTASONE) 10 MG tablet, Take 1 tablet (10 mg total) by mouth daily with breakfast. 6 D taper, start with 60 mg then decrease daily by 10 mg (Patient not taking: Reported on 05/19/2017), Disp: 21 tablet, Rfl: 0  Review of Systems  Constitutional: Negative.   HENT: Negative.   Eyes: Negative.   Respiratory: Negative.   Cardiovascular: Negative.   Gastrointestinal: Negative.   Endocrine: Negative.   Genitourinary: Negative.   Musculoskeletal: Positive for arthralgias.  Skin: Negative.   Allergic/Immunologic: Negative.   Neurological: Negative.   Hematological: Negative.   Psychiatric/Behavioral: Negative.     Social History   Tobacco Use  . Smoking status: Never Smoker  . Smokeless tobacco: Never Used  Substance Use Topics  . Alcohol use: Yes    Alcohol/week: 0.0 oz    Comment: occasional - holidays   Objective:   BP (!) 142/80 (BP Location: Left Arm, Patient Position: Sitting, Cuff Size: Normal)   Pulse 78   Temp 98.4 F (  36.9 C) (Oral)   Resp 16   Wt 193 lb (87.5 kg)   BMI 28.50 kg/m  Vitals:   05/19/17 0818  BP: (!) 142/80  Pulse: 78  Resp: 16  Temp: 98.4 F (36.9 C)  TempSrc: Oral  Weight: 193 lb (87.5 kg)     Physical Exam  Constitutional: He is oriented to person, place, and time. He appears well-developed and well-nourished.  HENT:  Head: Normocephalic and atraumatic.  Eyes: Conjunctivae are normal. No scleral icterus.  Neck: No thyromegaly present.  Cardiovascular: Normal rate, regular rhythm and normal  heart sounds.  Pulmonary/Chest: Effort normal and breath sounds normal.  Abdominal: Soft.  Musculoskeletal: He exhibits no edema.  Neurological: He is alert and oriented to person, place, and time.  Skin: Skin is warm and dry.  Psychiatric: He has a normal mood and affect. His behavior is normal. Judgment and thought content normal.        Assessment & Plan:     1. Upper back strain, initial encounter  - cyclobenzaprine (FLEXERIL) 10 MG tablet; Take 1 tablet (10 mg total) by mouth 3 (three) times daily as needed for muscle spasms.  Dispense: 30 tablet; Refill: 0  2. Essential hypertension  - lisinopril (PRINIVIL,ZESTRIL) 10 MG tablet; Take 1 tablet (10 mg total) by mouth daily.  Dispense: 90 tablet; Refill: 3  3. Insomnia, unspecified type  - zolpidem (AMBIEN) 10 MG tablet; Take 1 tablet (10 mg total) by mouth at bedtime.  Dispense: 90 tablet; Refill: 1  4. Thoracic myofascial strain, initial encounter   5. ED (erectile dysfunction) of organic origin  - tadalafil (CIALIS) 20 MG tablet; take 1 tablet by mouth if needed  Dispense: 6 tablet; Refill: 12     I have done the exam and reviewed the chart and it is accurate to the best of my knowledge. Development worker, community has been used and  any errors in dictation or transcription are unintentional. Miguel Aschoff M.D. [place], MD  [facility]

## 2017-06-09 LAB — LIPID PANEL
CHOLESTEROL: 207 — AB (ref 0–200)
HDL: 47 (ref 35–70)
LDL Cholesterol: 134
LDL/HDL RATIO: 4.4
TRIGLYCERIDES: 131 (ref 40–160)

## 2017-06-09 LAB — HEPATIC FUNCTION PANEL
ALK PHOS: 88 (ref 25–125)
ALT: 46 — AB (ref 10–40)
AST: 112 — AB (ref 14–40)
BILIRUBIN, TOTAL: 0.8

## 2017-06-09 LAB — BASIC METABOLIC PANEL
BUN: 22 — AB (ref 4–21)
Creatinine: 1 (ref 0.6–1.3)
Glucose: 106
POTASSIUM: 4.6 (ref 3.4–5.3)
SODIUM: 140 (ref 137–147)

## 2017-06-09 LAB — CBC AND DIFFERENTIAL
HCT: 45 (ref 41–53)
Hemoglobin: 15.3 (ref 13.5–17.5)
PLATELETS: 199 (ref 150–399)
WBC: 7.6

## 2017-06-09 LAB — HEMOGLOBIN A1C: HEMOGLOBIN A1C: 5.7

## 2017-06-16 ENCOUNTER — Encounter: Payer: Self-pay | Admitting: Emergency Medicine

## 2017-06-24 ENCOUNTER — Ambulatory Visit (INDEPENDENT_AMBULATORY_CARE_PROVIDER_SITE_OTHER): Payer: BLUE CROSS/BLUE SHIELD | Admitting: Family Medicine

## 2017-06-24 ENCOUNTER — Encounter: Payer: Self-pay | Admitting: Family Medicine

## 2017-06-24 VITALS — BP 128/72 | HR 100 | Temp 98.9°F | Resp 16 | Ht 69.0 in | Wt 190.0 lb

## 2017-06-24 DIAGNOSIS — I10 Essential (primary) hypertension: Secondary | ICD-10-CM | POA: Diagnosis not present

## 2017-06-24 DIAGNOSIS — M25512 Pain in left shoulder: Secondary | ICD-10-CM | POA: Diagnosis not present

## 2017-06-24 NOTE — Progress Notes (Signed)
Patient: Christopher Rose Male    DOB: 1960-04-22   57 y.o.   MRN: [id number] Visit Date: 06/24/2017  Today's Provider: Wilhemena Durie, MD   Chief Complaint  Patient presents with  . Hypertension  . Shoulder Pain   Subjective:    HPI  Hypertension, follow-up:  BP Readings from Last 3 Encounters:  06/24/17 128/72  05/19/17 (!) 142/80  03/25/17 132/88    He was last seen for hypertension 1 months ago.  BP at that visit was 142/80. Management since that visit includes no changes. He reports good compliance with treatment. He is not having side effects.  He is not exercising. He is adherent to low salt diet.   Outside blood pressures are checked occasionally. He is experiencing none.  Patient denies exertional chest pressure/discomfort, lower extremity edema and palpitations.   Weight trend: stable Wt Readings from Last 3 Encounters:  06/24/17 190 lb (86.2 kg)  05/19/17 193 lb (87.5 kg)  03/25/17 198 lb (89.8 kg)    Current diet: well balanced   Shoulder pain, follow up: Patient was seen in the office 1 month ago. He was prescribed Flexeril 10mg  TID to help with the shoulder pain. He reports that he applied heat and ice consistently for 1 month. He stopped taking Flexeril after 2 weeks due to not feeling any changes in his symptoms. Patient reports that the pain has improved, but it is still there.   Allergies  Allergen Reactions  . Penicillins   . Amoxicillin Rash     Current Outpatient Medications:  .  clomiPHENE (CLOMID) 50 MG tablet, Take 1/2 tablet daily, Disp: 90 tablet, Rfl: 4 .  lisinopril (PRINIVIL,ZESTRIL) 10 MG tablet, Take 1 tablet (10 mg total) by mouth daily., Disp: 90 tablet, Rfl: 3 .  metoprolol succinate (TOPROL-XL) 50 MG 24 hr tablet, Take 1 tablet (50 mg total) by mouth daily., Disp: 30 tablet, Rfl: 12 .  tadalafil (CIALIS) 20 MG tablet, take 1 tablet by mouth if needed, Disp: 6 tablet, Rfl: 12 .  zolpidem (AMBIEN) 10 MG tablet, Take 1  tablet (10 mg total) by mouth at bedtime., Disp: 90 tablet, Rfl: 1 .  cyclobenzaprine (FLEXERIL) 10 MG tablet, Take 1 tablet (10 mg total) by mouth 3 (three) times daily as needed for muscle spasms. (Patient not taking: Reported on 06/24/2017), Disp: 30 tablet, Rfl: 0 .  meloxicam (MOBIC) 15 MG tablet, Take 1 tablet (15 mg total) by mouth daily. (Patient not taking: Reported on 05/19/2017), Disp: 90 tablet, Rfl: 3 .  predniSONE (DELTASONE) 10 MG tablet, Take 1 tablet (10 mg total) by mouth daily with breakfast. 6 D taper, start with 60 mg then decrease daily by 10 mg (Patient not taking: Reported on 05/19/2017), Disp: 21 tablet, Rfl: 0  Review of Systems  Constitutional: Negative for activity change, appetite change, chills, diaphoresis, fatigue, fever and unexpected weight change.  Respiratory: Negative.   Cardiovascular: Negative for chest pain, palpitations and leg swelling.  Musculoskeletal: Positive for arthralgias and myalgias.  Neurological: Negative for dizziness, light-headedness and headaches.  Psychiatric/Behavioral: Negative.     Social History   Tobacco Use  . Smoking status: Never Smoker  . Smokeless tobacco: Never Used  Substance Use Topics  . Alcohol use: Yes    Alcohol/week: 0.0 oz    Comment: occasional - holidays   Objective:   BP 128/72 (BP Location: Left Arm, Patient Position: Sitting, Cuff Size: Normal)   Pulse 100   Temp  98.9 F (37.2 C)   Resp 16   Ht 5\' 9"  (1.753 m)   Wt 190 lb (86.2 kg)   SpO2 97%   BMI 28.06 kg/m  Vitals:   06/24/17 0816  BP: 128/72  Pulse: 100  Resp: 16  Temp: 98.9 F (37.2 C)  SpO2: 97%  Weight: 190 lb (86.2 kg)  Height: 5\' 9"  (1.753 m)     Physical Exam  Constitutional: He is oriented to person, place, and time. He appears well-developed and well-nourished.  HENT:  Head: Normocephalic and atraumatic.  Right Ear: External ear normal.  Left Ear: External ear normal.  Nose: Nose normal.  Eyes: Conjunctivae are normal. No  scleral icterus.  Neck: No thyromegaly present.  Cardiovascular: Normal rate, regular rhythm and normal heart sounds.  Pulmonary/Chest: Effort normal and breath sounds normal.  Abdominal: Soft.  Musculoskeletal: He exhibits no edema.  Neurological: He is alert and oriented to person, place, and time.  Skin: Skin is warm and dry.  Psychiatric: He has a normal mood and affect. His behavior is normal. Judgment and thought content normal.        Assessment & Plan:     1. Essential hypertension Controlled.  2. Acute pain of left shoulder Mobic/omeprazole for 1 month. Ortho referral if no better.       I have done the exam and reviewed the above chart and it is accurate to the best of my knowledge. Development worker, community has been used in this note in any air is in the dictation or transcription are unintentional.  Wilhemena Durie, MD  [place]

## 2017-06-24 NOTE — Patient Instructions (Signed)
Start Mobic and OTC Omeprazole for 1 month.  Continue muscle relaxer until 07/08/17. If symptoms have not improved by then, call us for Ortho referral.

## 2017-07-07 ENCOUNTER — Telehealth: Payer: Self-pay | Admitting: Family Medicine

## 2017-07-07 DIAGNOSIS — M25512 Pain in left shoulder: Secondary | ICD-10-CM

## 2017-07-07 NOTE — Telephone Encounter (Signed)
Pt stated he saw Dr. Rosanna Randy on 06/24/17 and advised if pt's shoulder wasn't improving by this week to call back and Dr. Rosanna Randy would refer pt to ortho. Pt stated that he would like to move forward with referral and would like to see Dr. Lenoria Chime with [facility]. Pt also stated that Wednesday, Thursday, or Friday in the afternoon work better for him during the week. Please advise. Thanks TNP

## 2017-07-07 NOTE — Telephone Encounter (Signed)
FYI-Referral ordered.  ED

## 2017-07-21 ENCOUNTER — Telehealth: Payer: Self-pay

## 2017-07-21 NOTE — Telephone Encounter (Signed)
Left message to call back  

## 2017-07-21 NOTE — Telephone Encounter (Signed)
-----   Message from Jerrol Banana., MD sent at 07/20/2017  1:25 PM EDT ----- Lipids ok

## 2017-07-23 NOTE — Telephone Encounter (Signed)
Left message advising pt.  (Per DPR)  Thanks,   -Laura  

## 2017-08-06 DIAGNOSIS — M5412 Radiculopathy, cervical region: Secondary | ICD-10-CM | POA: Diagnosis not present

## 2017-08-06 DIAGNOSIS — M542 Cervicalgia: Secondary | ICD-10-CM | POA: Diagnosis not present

## 2017-08-06 DIAGNOSIS — M25512 Pain in left shoulder: Secondary | ICD-10-CM | POA: Diagnosis not present

## 2017-08-06 DIAGNOSIS — M4692 Unspecified inflammatory spondylopathy, cervical region: Secondary | ICD-10-CM | POA: Diagnosis not present

## 2017-08-17 DIAGNOSIS — M4692 Unspecified inflammatory spondylopathy, cervical region: Secondary | ICD-10-CM | POA: Diagnosis not present

## 2017-08-17 DIAGNOSIS — M6281 Muscle weakness (generalized): Secondary | ICD-10-CM | POA: Diagnosis not present

## 2017-08-17 DIAGNOSIS — M542 Cervicalgia: Secondary | ICD-10-CM | POA: Diagnosis not present

## 2017-08-17 DIAGNOSIS — M5412 Radiculopathy, cervical region: Secondary | ICD-10-CM | POA: Diagnosis not present

## 2017-08-26 DIAGNOSIS — M4692 Unspecified inflammatory spondylopathy, cervical region: Secondary | ICD-10-CM | POA: Diagnosis not present

## 2017-08-26 DIAGNOSIS — M6281 Muscle weakness (generalized): Secondary | ICD-10-CM | POA: Diagnosis not present

## 2017-08-26 DIAGNOSIS — M542 Cervicalgia: Secondary | ICD-10-CM | POA: Diagnosis not present

## 2017-08-26 DIAGNOSIS — M5412 Radiculopathy, cervical region: Secondary | ICD-10-CM | POA: Diagnosis not present

## 2017-09-03 DIAGNOSIS — M6281 Muscle weakness (generalized): Secondary | ICD-10-CM | POA: Diagnosis not present

## 2017-09-03 DIAGNOSIS — M5412 Radiculopathy, cervical region: Secondary | ICD-10-CM | POA: Diagnosis not present

## 2017-09-03 DIAGNOSIS — M4692 Unspecified inflammatory spondylopathy, cervical region: Secondary | ICD-10-CM | POA: Diagnosis not present

## 2017-09-03 DIAGNOSIS — M542 Cervicalgia: Secondary | ICD-10-CM | POA: Diagnosis not present

## 2017-09-16 DIAGNOSIS — M542 Cervicalgia: Secondary | ICD-10-CM | POA: Diagnosis not present

## 2017-09-16 DIAGNOSIS — M4692 Unspecified inflammatory spondylopathy, cervical region: Secondary | ICD-10-CM | POA: Diagnosis not present

## 2017-09-16 DIAGNOSIS — M6281 Muscle weakness (generalized): Secondary | ICD-10-CM | POA: Diagnosis not present

## 2017-09-16 DIAGNOSIS — M5412 Radiculopathy, cervical region: Secondary | ICD-10-CM | POA: Diagnosis not present

## 2017-09-17 DIAGNOSIS — M5412 Radiculopathy, cervical region: Secondary | ICD-10-CM | POA: Diagnosis not present

## 2017-09-17 DIAGNOSIS — M4692 Unspecified inflammatory spondylopathy, cervical region: Secondary | ICD-10-CM | POA: Diagnosis not present

## 2017-09-20 ENCOUNTER — Other Ambulatory Visit: Payer: Self-pay | Admitting: Surgery

## 2017-09-20 DIAGNOSIS — M5412 Radiculopathy, cervical region: Secondary | ICD-10-CM

## 2017-10-02 ENCOUNTER — Ambulatory Visit
Admission: RE | Admit: 2017-10-02 | Discharge: 2017-10-02 | Disposition: A | Discharge status: Home / Self Care | Payer: BLUE CROSS/BLUE SHIELD | Source: Ambulatory Visit | Attending: Surgery | Admitting: Surgery

## 2017-10-02 DIAGNOSIS — M4802 Spinal stenosis, cervical region: Secondary | ICD-10-CM | POA: Insufficient documentation

## 2017-10-02 DIAGNOSIS — M5412 Radiculopathy, cervical region: Secondary | ICD-10-CM | POA: Insufficient documentation

## 2017-10-02 DIAGNOSIS — M50223 Other cervical disc displacement at C6-C7 level: Secondary | ICD-10-CM | POA: Diagnosis not present

## 2017-10-02 DIAGNOSIS — M542 Cervicalgia: Secondary | ICD-10-CM | POA: Diagnosis not present

## 2017-10-13 DIAGNOSIS — M5412 Radiculopathy, cervical region: Secondary | ICD-10-CM | POA: Diagnosis not present

## 2017-10-13 DIAGNOSIS — M4692 Unspecified inflammatory spondylopathy, cervical region: Secondary | ICD-10-CM | POA: Diagnosis not present

## 2017-10-19 DIAGNOSIS — M5412 Radiculopathy, cervical region: Secondary | ICD-10-CM | POA: Diagnosis not present

## 2017-10-19 DIAGNOSIS — M5020 Other cervical disc displacement, unspecified cervical region: Secondary | ICD-10-CM | POA: Diagnosis not present

## 2017-10-28 DIAGNOSIS — M5412 Radiculopathy, cervical region: Secondary | ICD-10-CM | POA: Diagnosis not present

## 2017-10-28 DIAGNOSIS — M5020 Other cervical disc displacement, unspecified cervical region: Secondary | ICD-10-CM | POA: Diagnosis not present

## 2017-11-17 ENCOUNTER — Other Ambulatory Visit: Payer: Self-pay | Admitting: Family Medicine

## 2017-11-17 DIAGNOSIS — G4700 Insomnia, unspecified: Secondary | ICD-10-CM

## 2017-11-23 DIAGNOSIS — M5412 Radiculopathy, cervical region: Secondary | ICD-10-CM | POA: Diagnosis not present

## 2017-11-23 DIAGNOSIS — M5020 Other cervical disc displacement, unspecified cervical region: Secondary | ICD-10-CM | POA: Diagnosis not present

## 2017-11-30 ENCOUNTER — Other Ambulatory Visit: Payer: Self-pay

## 2017-11-30 ENCOUNTER — Ambulatory Visit
Admission: RE | Admit: 2017-11-30 | Discharge: 2017-11-30 | Disposition: A | Discharge status: Home / Self Care | Payer: BLUE CROSS/BLUE SHIELD | Source: Ambulatory Visit | Attending: Neurological Surgery | Admitting: Neurological Surgery

## 2017-11-30 ENCOUNTER — Encounter
Admission: RE | Admit: 2017-11-30 | Discharge: 2017-11-30 | Disposition: A | Discharge status: Home / Self Care | Payer: BLUE CROSS/BLUE SHIELD | Source: Ambulatory Visit | Attending: Neurological Surgery | Admitting: Neurological Surgery

## 2017-11-30 DIAGNOSIS — Z01811 Encounter for preprocedural respiratory examination: Secondary | ICD-10-CM | POA: Insufficient documentation

## 2017-11-30 DIAGNOSIS — Z01810 Encounter for preprocedural cardiovascular examination: Secondary | ICD-10-CM | POA: Diagnosis not present

## 2017-11-30 DIAGNOSIS — Z01818 Encounter for other preprocedural examination: Secondary | ICD-10-CM | POA: Diagnosis not present

## 2017-11-30 LAB — URINALYSIS, ROUTINE W REFLEX MICROSCOPIC
BILIRUBIN URINE: NEGATIVE
Glucose, UA: NEGATIVE mg/dL
HGB URINE DIPSTICK: NEGATIVE
Ketones, ur: NEGATIVE mg/dL
Leukocytes, UA: NEGATIVE
Nitrite: NEGATIVE
PH: 7 (ref 5.0–8.0)
Protein, ur: NEGATIVE mg/dL
SPECIFIC GRAVITY, URINE: 1.003 — AB (ref 1.005–1.030)

## 2017-11-30 LAB — CBC
HEMATOCRIT: 42.9 % (ref 39.0–52.0)
HEMOGLOBIN: 15 g/dL (ref 13.0–17.0)
MCH: 30.9 pg (ref 26.0–34.0)
MCHC: 35 g/dL (ref 30.0–36.0)
MCV: 88.5 fL (ref 80.0–100.0)
NRBC: 0 % (ref 0.0–0.2)
Platelets: 228 10*3/uL (ref 150–400)
RBC: 4.85 MIL/uL (ref 4.22–5.81)
RDW: 12.1 % (ref 11.5–15.5)
WBC: 9.2 10*3/uL (ref 4.0–10.5)

## 2017-11-30 LAB — DIFFERENTIAL
ABS IMMATURE GRANULOCYTES: 0.04 10*3/uL (ref 0.00–0.07)
BASOS ABS: 0 10*3/uL (ref 0.0–0.1)
BASOS PCT: 0 %
Eosinophils Absolute: 0.1 10*3/uL (ref 0.0–0.5)
Eosinophils Relative: 1 %
IMMATURE GRANULOCYTES: 0 %
Lymphocytes Relative: 20 %
Lymphs Abs: 1.8 10*3/uL (ref 0.7–4.0)
MONO ABS: 0.6 10*3/uL (ref 0.1–1.0)
Monocytes Relative: 7 %
NEUTROS ABS: 6.5 10*3/uL (ref 1.7–7.7)
NEUTROS PCT: 72 %

## 2017-11-30 LAB — BASIC METABOLIC PANEL
Anion gap: 9 (ref 5–15)
BUN: 13 mg/dL (ref 6–20)
CO2: 29 mmol/L (ref 22–32)
Calcium: 9.2 mg/dL (ref 8.9–10.3)
Chloride: 104 mmol/L (ref 98–111)
Creatinine, Ser: 1.02 mg/dL (ref 0.61–1.24)
Glucose, Bld: 87 mg/dL (ref 70–99)
Potassium: 4 mmol/L (ref 3.5–5.1)
SODIUM: 142 mmol/L (ref 135–145)

## 2017-11-30 LAB — TYPE AND SCREEN
ABO/RH(D): O POS
Antibody Screen: NEGATIVE

## 2017-11-30 LAB — PROTIME-INR
INR: 1
PROTHROMBIN TIME: 13.1 s (ref 11.4–15.2)

## 2017-11-30 LAB — SURGICAL PCR SCREEN
MRSA, PCR: NEGATIVE
Staphylococcus aureus: NEGATIVE

## 2017-11-30 LAB — APTT: aPTT: 29 seconds (ref 24–36)

## 2017-11-30 NOTE — Patient Instructions (Signed)
Your procedure is scheduled on: Monday 12/06/17 Report to [facility]. To find out your arrival time please call [phone] between 1PM - 3PM on Friday 12/04/17.  Remember: Instructions that are not followed completely may result in serious medical risk, up to and including death, or upon the discretion of your surgeon and anesthesiologist your surgery may need to be rescheduled.     _X__ 1. Do not eat food after midnight the night before your procedure.                 No gum chewing or hard candies. You may drink clear liquids up to 2 hours                 before you are scheduled to arrive for your surgery- DO not drink clear                 liquids within 2 hours of the start of your surgery.                 Clear Liquids include:  water, apple juice without pulp, clear carbohydrate                 drink such as Clearfast or Gatorade, Black Coffee or Tea (Do not add                 anything to coffee or tea).  __X__2.  On the morning of surgery brush your teeth with toothpaste and water, you                 may rinse your mouth with mouthwash if you wish.  Do not swallow any              toothpaste of mouthwash.     _X__ 3.  No Alcohol for 24 hours before or after surgery.   _X__ 4.  Do Not Smoke or use e-cigarettes For 24 Hours Prior to Your Surgery.                 Do not use any chewable tobacco products for at least 6 hours prior to                 surgery.  ____  5.  Bring all medications with you on the day of surgery if instructed.   __X__  6.  Notify your doctor if there is any change in your medical condition      (cold, fever, infections).     Do not wear jewelry, make-up, hairpins, clips or nail polish. Do not wear lotions, powders, or perfumes.  Do not shave 48 hours prior to surgery. Men may shave face and neck. Do not bring valuables to the hospital.    [facility] is not responsible for any belongings or  valuables.  Contacts, dentures/partials or body piercings may not be worn into surgery. Bring a case for your contacts, glasses or hearing aids, a denture cup will be supplied. Leave your suitcase in the car. After surgery it may be brought to your room. For patients admitted to the hospital, discharge time is determined by your treatment team.   Patients discharged the day of surgery will not be allowed to drive home.   Please read over the following fact sheets that you were given:   MRSA Information  __X__ Take these medicines the morning of surgery with A SIP OF WATER:  1. NONE  2.   3.   4.  5.  6.  ____ Fleet Enema (as directed)   __X__ Use CHG Soap/SAGE wipes as directed  ____ Use inhalers on the day of surgery  ____ Stop metformin/Janumet/Farxiga 2 days prior to surgery    ____ Take 1/2 of usual insulin dose the night before surgery. No insulin the morning          of surgery.   ____ Stop Blood Thinners Coumadin/Plavix/Xarelto/Pleta/Pradaxa/Eliquis/Effient/Aspirin  on   Or contact your Surgeon, Cardiologist or Medical Doctor regarding  ability to stop your blood thinners  __X__ Stop Anti-inflammatories 7 days before surgery such as Advil, Ibuprofen, Motrin,  BC or Goodies Powder, Naprosyn, Naproxen, Aleve, Aspirin MELOXICAM STOP TODAY, YOU MAY USE TYLENOL INSTEAD    __X__ Stop all herbal supplements, fish oil or vitamin E until after surgery.    ____ Bring C-Pap to the hospital.

## 2017-12-06 ENCOUNTER — Ambulatory Visit: Payer: BLUE CROSS/BLUE SHIELD | Admitting: Certified Registered"

## 2017-12-06 ENCOUNTER — Other Ambulatory Visit: Payer: Self-pay

## 2017-12-06 ENCOUNTER — Encounter: Payer: Self-pay | Admitting: *Deleted

## 2017-12-06 ENCOUNTER — Ambulatory Visit: Payer: BLUE CROSS/BLUE SHIELD

## 2017-12-06 ENCOUNTER — Encounter
Admission: RE | Disposition: A | Discharge status: Home / Self Care | Payer: Self-pay | Source: Ambulatory Visit | Attending: Neurological Surgery

## 2017-12-06 ENCOUNTER — Observation Stay
Admission: RE | Admit: 2017-12-06 | Discharge: 2017-12-07 | Disposition: A | Discharge status: Home / Self Care | Payer: BLUE CROSS/BLUE SHIELD | Source: Ambulatory Visit | Attending: Neurological Surgery | Admitting: Neurological Surgery

## 2017-12-06 DIAGNOSIS — M50123 Cervical disc disorder at C6-C7 level with radiculopathy: Secondary | ICD-10-CM | POA: Diagnosis not present

## 2017-12-06 DIAGNOSIS — I10 Essential (primary) hypertension: Secondary | ICD-10-CM | POA: Diagnosis not present

## 2017-12-06 DIAGNOSIS — M5412 Radiculopathy, cervical region: Secondary | ICD-10-CM | POA: Diagnosis present

## 2017-12-06 DIAGNOSIS — E7800 Pure hypercholesterolemia, unspecified: Secondary | ICD-10-CM | POA: Insufficient documentation

## 2017-12-06 DIAGNOSIS — Z79899 Other long term (current) drug therapy: Secondary | ICD-10-CM | POA: Diagnosis not present

## 2017-12-06 DIAGNOSIS — M50223 Other cervical disc displacement at C6-C7 level: Secondary | ICD-10-CM | POA: Diagnosis not present

## 2017-12-06 DIAGNOSIS — M50023 Cervical disc disorder at C6-C7 level with myelopathy: Secondary | ICD-10-CM | POA: Diagnosis not present

## 2017-12-06 DIAGNOSIS — Z419 Encounter for procedure for purposes other than remedying health state, unspecified: Secondary | ICD-10-CM

## 2017-12-06 DIAGNOSIS — Z791 Long term (current) use of non-steroidal anti-inflammatories (NSAID): Secondary | ICD-10-CM | POA: Diagnosis not present

## 2017-12-06 DIAGNOSIS — Z981 Arthrodesis status: Secondary | ICD-10-CM | POA: Diagnosis not present

## 2017-12-06 HISTORY — PX: ANTERIOR CERVICAL DECOMP/DISCECTOMY FUSION: SHX1161

## 2017-12-06 LAB — ABO/RH: ABO/RH(D): O POS

## 2017-12-06 SURGERY — ANTERIOR CERVICAL DECOMPRESSION/DISCECTOMY FUSION 1 LEVEL
Anesthesia: General | Site: Neck

## 2017-12-06 MED ORDER — METOPROLOL SUCCINATE ER 50 MG PO TB24
50.0000 mg | ORAL_TABLET | Freq: Every day | ORAL | Status: DC
  Administered 2017-12-06: 50 mg via ORAL
  Filled 2017-12-06 (×2): qty 1

## 2017-12-06 MED ORDER — LACTATED RINGERS IV SOLN
INTRAVENOUS | Status: DC
  Administered 2017-12-06: 07:00:00 via INTRAVENOUS

## 2017-12-06 MED ORDER — ONDANSETRON HCL 4 MG/2ML IJ SOLN
INTRAMUSCULAR | Status: AC
  Filled 2017-12-06: qty 2

## 2017-12-06 MED ORDER — DEXAMETHASONE SODIUM PHOSPHATE 10 MG/ML IJ SOLN
INTRAMUSCULAR | Status: DC | PRN
  Administered 2017-12-06: 8 mg via INTRAVENOUS

## 2017-12-06 MED ORDER — MEPERIDINE HCL 50 MG/ML IJ SOLN: 6.2500 mg | INTRAMUSCULAR | Status: DC | PRN

## 2017-12-06 MED ORDER — LISINOPRIL 10 MG PO TABS
10.0000 mg | ORAL_TABLET | Freq: Every day | ORAL | Status: DC
  Administered 2017-12-06 – 2017-12-07 (×2): 10 mg via ORAL
  Filled 2017-12-06 (×2): qty 1

## 2017-12-06 MED ORDER — VANCOMYCIN HCL 10 G IV SOLR
1250.0000 mg | INTRAVENOUS | Status: AC
  Administered 2017-12-06: 1250 mg via INTRAVENOUS
  Filled 2017-12-06: qty 1250

## 2017-12-06 MED ORDER — GELATIN ABSORBABLE 12-7 MM EX MISC
CUTANEOUS | Status: AC
  Filled 2017-12-06: qty 1

## 2017-12-06 MED ORDER — ZOLPIDEM TARTRATE 5 MG PO TABS
10.0000 mg | ORAL_TABLET | Freq: Every day | ORAL | Status: DC
  Administered 2017-12-06: 10 mg via ORAL
  Filled 2017-12-06 (×2): qty 2

## 2017-12-06 MED ORDER — LIDOCAINE HCL 4 % MT SOLN
OROMUCOSAL | Status: DC | PRN
  Administered 2017-12-06: 3 mL via TOPICAL

## 2017-12-06 MED ORDER — MENTHOL 3 MG MT LOZG
1.0000 | LOZENGE | OROMUCOSAL | Status: DC | PRN
  Filled 2017-12-06: qty 9

## 2017-12-06 MED ORDER — HYDROXYZINE HCL 50 MG PO TABS
50.0000 mg | ORAL_TABLET | ORAL | Status: DC | PRN
  Filled 2017-12-06: qty 1

## 2017-12-06 MED ORDER — LACTATED RINGERS IV SOLN
INTRAVENOUS | Status: DC | PRN
  Administered 2017-12-06: 08:00:00 via INTRAVENOUS

## 2017-12-06 MED ORDER — PROPOFOL 500 MG/50ML IV EMUL
INTRAVENOUS | Status: AC
  Filled 2017-12-06: qty 50

## 2017-12-06 MED ORDER — HYDROMORPHONE HCL 1 MG/ML IJ SOLN
0.5000 mg | INTRAMUSCULAR | Status: DC | PRN
  Administered 2017-12-06 (×2): 0.5 mg via INTRAVENOUS
  Filled 2017-12-06 (×2): qty 1

## 2017-12-06 MED ORDER — PROPOFOL 10 MG/ML IV BOLUS
INTRAVENOUS | Status: DC | PRN
  Administered 2017-12-06: 170 mg via INTRAVENOUS

## 2017-12-06 MED ORDER — ACETAMINOPHEN 500 MG PO TABS
1000.0000 mg | ORAL_TABLET | Freq: Four times a day (QID) | ORAL | Status: AC
  Administered 2017-12-06 – 2017-12-07 (×4): 1000 mg via ORAL
  Filled 2017-12-06 (×4): qty 2

## 2017-12-06 MED ORDER — LIDOCAINE HCL (PF) 2 % IJ SOLN
INTRAMUSCULAR | Status: AC
  Filled 2017-12-06: qty 10

## 2017-12-06 MED ORDER — MIDAZOLAM HCL 2 MG/2ML IJ SOLN
INTRAMUSCULAR | Status: DC | PRN
  Administered 2017-12-06: 2 mg via INTRAVENOUS

## 2017-12-06 MED ORDER — METHOCARBAMOL 1000 MG/10ML IJ SOLN
500.0000 mg | Freq: Four times a day (QID) | INTRAVENOUS | Status: DC | PRN
  Filled 2017-12-06: qty 5

## 2017-12-06 MED ORDER — MIDAZOLAM HCL 2 MG/2ML IJ SOLN
INTRAMUSCULAR | Status: AC
  Filled 2017-12-06: qty 2

## 2017-12-06 MED ORDER — DEXAMETHASONE SODIUM PHOSPHATE 10 MG/ML IJ SOLN
INTRAMUSCULAR | Status: AC
  Filled 2017-12-06: qty 1

## 2017-12-06 MED ORDER — PHENOL 1.4 % MT LIQD
1.0000 | OROMUCOSAL | Status: DC | PRN
  Filled 2017-12-06: qty 177

## 2017-12-06 MED ORDER — FAMOTIDINE 20 MG PO TABS
ORAL_TABLET | ORAL | Status: AC
  Administered 2017-12-06: 20 mg via ORAL
  Filled 2017-12-06: qty 1

## 2017-12-06 MED ORDER — SODIUM CHLORIDE 0.9% FLUSH: 3.0000 mL | INTRAVENOUS | Status: DC | PRN

## 2017-12-06 MED ORDER — ONDANSETRON HCL 4 MG/2ML IJ SOLN
INTRAMUSCULAR | Status: DC | PRN
  Administered 2017-12-06: 4 mg via INTRAVENOUS

## 2017-12-06 MED ORDER — SODIUM CHLORIDE 0.9 % IV SOLN
INTRAVENOUS | Status: DC | PRN
  Administered 2017-12-06: 25 ug/min via INTRAVENOUS

## 2017-12-06 MED ORDER — OXYCODONE HCL 5 MG/5ML PO SOLN: 5.0000 mg | Freq: Once | ORAL | Status: DC | PRN

## 2017-12-06 MED ORDER — THROMBIN 5000 UNITS EX SOLR
CUTANEOUS | Status: AC
  Filled 2017-12-06: qty 5000

## 2017-12-06 MED ORDER — PROPOFOL 10 MG/ML IV BOLUS
INTRAVENOUS | Status: AC
  Filled 2017-12-06: qty 20

## 2017-12-06 MED ORDER — LIDOCAINE HCL (PF) 1 % IJ SOLN
INTRAMUSCULAR | Status: AC
  Filled 2017-12-06: qty 30

## 2017-12-06 MED ORDER — PROPOFOL 500 MG/50ML IV EMUL
INTRAVENOUS | Status: DC | PRN
  Administered 2017-12-06: 125 ug/kg/min via INTRAVENOUS

## 2017-12-06 MED ORDER — LIDOCAINE HCL 1 % IJ SOLN
INTRAMUSCULAR | Status: DC | PRN
  Administered 2017-12-06: 3.5 mL

## 2017-12-06 MED ORDER — ACETAMINOPHEN 650 MG RE SUPP: 650.0000 mg | RECTAL | Status: DC | PRN

## 2017-12-06 MED ORDER — REMIFENTANIL HCL 2 MG IV SOLR
INTRAVENOUS | Status: DC | PRN
  Administered 2017-12-06: .2 ug/kg/min via INTRAVENOUS

## 2017-12-06 MED ORDER — FENTANYL CITRATE (PF) 250 MCG/5ML IJ SOLN
INTRAMUSCULAR | Status: AC
  Filled 2017-12-06: qty 5

## 2017-12-06 MED ORDER — OXYCODONE HCL 5 MG PO TABS: 5.0000 mg | ORAL_TABLET | Freq: Once | ORAL | Status: DC | PRN

## 2017-12-06 MED ORDER — OXYCODONE HCL 5 MG PO TABS: 5.0000 mg | ORAL_TABLET | ORAL | Status: DC | PRN

## 2017-12-06 MED ORDER — SENNOSIDES-DOCUSATE SODIUM 8.6-50 MG PO TABS: 1.0000 | ORAL_TABLET | Freq: Every evening | ORAL | Status: DC | PRN

## 2017-12-06 MED ORDER — THROMBIN 5000 UNITS EX SOLR
CUTANEOUS | Status: DC | PRN
  Administered 2017-12-06: 5000 [IU] via TOPICAL

## 2017-12-06 MED ORDER — REMIFENTANIL HCL 1 MG IV SOLR
INTRAVENOUS | Status: AC
  Filled 2017-12-06: qty 1000

## 2017-12-06 MED ORDER — CLOMIPHENE CITRATE 50 MG PO TABS
25.0000 mg | ORAL_TABLET | Freq: Every day | ORAL | Status: DC
  Administered 2017-12-06 – 2017-12-07 (×2): 25 mg via ORAL
  Filled 2017-12-06 (×4): qty 1

## 2017-12-06 MED ORDER — SODIUM CHLORIDE FLUSH 0.9 % IV SOLN
INTRAVENOUS | Status: AC
  Filled 2017-12-06: qty 10

## 2017-12-06 MED ORDER — FENTANYL CITRATE (PF) 100 MCG/2ML IJ SOLN
INTRAMUSCULAR | Status: DC | PRN
  Administered 2017-12-06: 50 ug via INTRAVENOUS
  Administered 2017-12-06: 100 ug via INTRAVENOUS

## 2017-12-06 MED ORDER — SUCCINYLCHOLINE CHLORIDE 20 MG/ML IJ SOLN
INTRAMUSCULAR | Status: DC | PRN
  Administered 2017-12-06: 100 mg via INTRAVENOUS

## 2017-12-06 MED ORDER — PHENYLEPHRINE HCL 10 MG/ML IJ SOLN
INTRAMUSCULAR | Status: AC
  Filled 2017-12-06: qty 1

## 2017-12-06 MED ORDER — OXYCODONE HCL 5 MG PO TABS
10.0000 mg | ORAL_TABLET | ORAL | Status: DC | PRN
  Administered 2017-12-07 (×2): 10 mg via ORAL
  Filled 2017-12-06 (×2): qty 2

## 2017-12-06 MED ORDER — LIDOCAINE HCL (CARDIAC) PF 100 MG/5ML IV SOSY
PREFILLED_SYRINGE | INTRAVENOUS | Status: DC | PRN
  Administered 2017-12-06: 80 mg via INTRAVENOUS

## 2017-12-06 MED ORDER — PHENYLEPHRINE HCL 10 MG/ML IJ SOLN
INTRAMUSCULAR | Status: DC | PRN
  Administered 2017-12-06: 100 ug via INTRAVENOUS

## 2017-12-06 MED ORDER — FAMOTIDINE 20 MG PO TABS
20.0000 mg | ORAL_TABLET | Freq: Once | ORAL | Status: AC
  Administered 2017-12-06: 20 mg via ORAL

## 2017-12-06 MED ORDER — ACETAMINOPHEN 325 MG PO TABS: 650.0000 mg | ORAL_TABLET | ORAL | Status: DC | PRN

## 2017-12-06 MED ORDER — FENTANYL CITRATE (PF) 100 MCG/2ML IJ SOLN
INTRAMUSCULAR | Status: AC
  Filled 2017-12-06: qty 2

## 2017-12-06 MED ORDER — REMIFENTANIL HCL 1 MG IV SOLR
INTRAVENOUS | Status: AC
  Filled 2017-12-06: qty 2000

## 2017-12-06 MED ORDER — METHOCARBAMOL 500 MG PO TABS: 500.0000 mg | ORAL_TABLET | Freq: Four times a day (QID) | ORAL | Status: DC | PRN

## 2017-12-06 MED ORDER — SODIUM CHLORIDE 0.9% FLUSH
3.0000 mL | Freq: Two times a day (BID) | INTRAVENOUS | Status: DC
  Administered 2017-12-06: 3 mL via INTRAVENOUS

## 2017-12-06 MED ORDER — SUCCINYLCHOLINE CHLORIDE 20 MG/ML IJ SOLN
INTRAMUSCULAR | Status: AC
  Filled 2017-12-06: qty 1

## 2017-12-06 MED ORDER — HYDROXYZINE HCL 50 MG/ML IM SOLN
50.0000 mg | INTRAMUSCULAR | Status: DC | PRN
  Filled 2017-12-06: qty 1

## 2017-12-06 MED ORDER — SODIUM CHLORIDE 0.9 % IV SOLN
INTRAVENOUS | Status: DC
  Administered 2017-12-06: 14:00:00 via INTRAVENOUS

## 2017-12-06 MED ORDER — SODIUM CHLORIDE 0.9 % IV SOLN: 250.0000 mL | INTRAVENOUS | Status: DC

## 2017-12-06 MED ORDER — FENTANYL CITRATE (PF) 100 MCG/2ML IJ SOLN
25.0000 ug | INTRAMUSCULAR | Status: AC | PRN
  Administered 2017-12-06 (×6): 25 ug via INTRAVENOUS

## 2017-12-06 SURGICAL SUPPLY — 73 items
ADH SKN CLS APL DERMABOND .7 (GAUZE/BANDAGES/DRESSINGS) ×1
AGENT HMST MTR 8 SURGIFLO (HEMOSTASIS) ×1
BIT DRILL 13 (BIT) ×1 IMPLANT
BIT DRILL 13MM (BIT) ×1
BLADE BOVIE TIP EXT 4 (BLADE) ×3 IMPLANT
BLADE SURG 15 STRL LF DISP TIS (BLADE) ×1 IMPLANT
BLADE SURG 15 STRL SS (BLADE) ×3
BUR NEURO DRILL SOFT 3.0X3.8M (BURR) ×3 IMPLANT
CAGE PEEK 7X14X11 (Cage) ×3 IMPLANT
CAGE SPNL 11X14X7XRADOPQ (Cage) IMPLANT
CANISTER SUCT 1200ML W/VALVE (MISCELLANEOUS) ×6 IMPLANT
CHLORAPREP W/TINT 26ML (MISCELLANEOUS) ×6 IMPLANT
COUNTER NEEDLE 20/40 LG (NEEDLE) ×3 IMPLANT
COVER LIGHT HANDLE STERIS (MISCELLANEOUS) ×6 IMPLANT
COVER WAND RF STERILE (DRAPES) ×3 IMPLANT
CRADLE LAMINECT ARM (MISCELLANEOUS) ×3 IMPLANT
CUP MEDICINE 2OZ PLAST GRAD ST (MISCELLANEOUS) ×6 IMPLANT
DERMABOND ADVANCED (GAUZE/BANDAGES/DRESSINGS) ×2
DERMABOND ADVANCED .7 DNX12 (GAUZE/BANDAGES/DRESSINGS) ×1 IMPLANT
DRAPE C-ARM 42X72 X-RAY (DRAPES) ×3 IMPLANT
DRAPE MICROSCOPE SPINE 48X150 (DRAPES) ×3 IMPLANT
DRAPE POUCH INSTRU U-SHP 10X18 (DRAPES) ×3 IMPLANT
DRAPE SHEET LG 3/4 BI-LAMINATE (DRAPES) ×3 IMPLANT
DRAPE SURG 17X11 SM STRL (DRAPES) ×12 IMPLANT
DRAPE THYROID T SHEET (DRAPES) ×3 IMPLANT
DRSG TEGADERM 4X4.75 (GAUZE/BANDAGES/DRESSINGS) ×3 IMPLANT
DRSG TELFA 4X3 1S NADH ST (GAUZE/BANDAGES/DRESSINGS) ×3 IMPLANT
ELECT CAUTERY BLADE TIP 2.5 (TIP) ×3
ELECT EZSTD 165MM 6.5IN (MISCELLANEOUS) ×3
ELECT REM PT RETURN 9FT ADLT (ELECTROSURGICAL) ×3
ELECTRODE CAUTERY BLDE TIP 2.5 (TIP) ×1 IMPLANT
ELECTRODE EZSTD 165MM 6.5IN (MISCELLANEOUS) ×1 IMPLANT
ELECTRODE REM PT RTRN 9FT ADLT (ELECTROSURGICAL) ×1 IMPLANT
FEE INTRAOP MONITOR IMPULS NCS (MISCELLANEOUS) IMPLANT
GAUZE 4X4 16PLY RFD (DISPOSABLE) ×3 IMPLANT
GAUZE PETRO XEROFOAM 1X8 (MISCELLANEOUS) ×3 IMPLANT
GAUZE SPONGE 4X4 12PLY STRL (GAUZE/BANDAGES/DRESSINGS) ×3 IMPLANT
GLOVE BIOGEL M 6.5 STRL (GLOVE) ×6 IMPLANT
GLOVE BIOGEL PI IND STRL 7.0 (GLOVE) ×1 IMPLANT
GLOVE BIOGEL PI INDICATOR 7.0 (GLOVE) ×2
GLOVE SURG SYN 8.0 (GLOVE) ×3 IMPLANT
GLOVE SURG SYN 8.0 PF PI (GLOVE) ×1 IMPLANT
GOWN STRL REUS W/ TWL XL LVL3 (GOWN DISPOSABLE) ×1 IMPLANT
GOWN STRL REUS W/TWL MED LVL3 (GOWN DISPOSABLE) ×3 IMPLANT
GOWN STRL REUS W/TWL XL LVL3 (GOWN DISPOSABLE) ×3
GRADUATE 1200CC STRL 31836 (MISCELLANEOUS) ×3 IMPLANT
HEMOSTAT SURGICEL 2X3 (HEMOSTASIS) ×3 IMPLANT
INTRAOP MONITOR FEE IMPULS NCS (MISCELLANEOUS) ×1
INTRAOP MONITOR FEE IMPULSE (MISCELLANEOUS) ×2
MARKER SKIN DUAL TIP RULER LAB (MISCELLANEOUS) ×3 IMPLANT
NDL SPNL 22GX3.5 QUINCKE BK (NEEDLE) ×1 IMPLANT
NEEDLE HYPO 22GX1.5 SAFETY (NEEDLE) ×3 IMPLANT
NEEDLE SPNL 22GX3.5 QUINCKE BK (NEEDLE) ×3 IMPLANT
PACK LAMINECTOMY NEURO (CUSTOM PROCEDURE TRAY) ×3 IMPLANT
PIN CASPAR 14 (PIN) ×1 IMPLANT
PIN CASPAR 14MM (PIN) ×3
PLATE ZEVO 1LVL 21MM (Plate) ×2 IMPLANT
PUTTY DBF 1CC CORTICAL FIBERS (Putty) ×2 IMPLANT
SCREW 3.5 SELFDRILL 15MM VARI (Screw) ×8 IMPLANT
SPOGE SURGIFLO 8M (HEMOSTASIS) ×2
SPONGE SURGIFLO 8M (HEMOSTASIS) ×1 IMPLANT
STAPLER SKIN PROX 35W (STAPLE) ×3 IMPLANT
SUT VIC AB 0 CT1 18XCR BRD 8 (SUTURE) ×2 IMPLANT
SUT VIC AB 0 CT1 8-18 (SUTURE) ×6
SUT VIC AB 2-0 CT1 18 (SUTURE) ×6 IMPLANT
SUT VIC AB 3-0 SH 8-18 (SUTURE) ×3 IMPLANT
SYR 20CC LL (SYRINGE) ×2 IMPLANT
SYR 30ML LL (SYRINGE) ×3 IMPLANT
TAPE CLOTH 3X10 WHT NS LF (GAUZE/BANDAGES/DRESSINGS) ×6 IMPLANT
TOWEL OR 17X26 4PK STRL BLUE (TOWEL DISPOSABLE) ×9 IMPLANT
TRAY FOLEY MTR SLVR 16FR STAT (SET/KITS/TRAYS/PACK) IMPLANT
TUBING CONNECTING 10 (TUBING) ×2 IMPLANT
TUBING CONNECTING 10' (TUBING) ×1

## 2017-12-06 NOTE — Anesthesia Preprocedure Evaluation (Signed)
Anesthesia Evaluation  Patient identified by MRN, date of birth, ID band Patient awake    Reviewed: Allergy & Precautions, NPO status , Patient's Chart, lab work & pertinent test results  History of Anesthesia Complications Negative for: history of anesthetic complications  Airway Mallampati: III  TM Distance: >3 FB Neck ROM: Limited    Dental no notable dental hx.    Pulmonary neg pulmonary ROS, neg sleep apnea, neg COPD,    breath sounds clear to auscultation- rhonchi (-) wheezing      Cardiovascular Exercise Tolerance: Good hypertension, Pt. on medications (-) CAD, (-) Past MI, (-) Cardiac Stents and (-) CABG + dysrhythmias (many years since last episode) Supra Ventricular Tachycardia  Rhythm:Regular Rate:Normal - Systolic murmurs and - Diastolic murmurs    Neuro/Psych negative neurological ROS  negative psych ROS   GI/Hepatic negative GI ROS, Neg liver ROS,   Endo/Other  negative endocrine ROSneg diabetes  Renal/GU negative Renal ROS     Musculoskeletal  (+) Arthritis ,   Abdominal (+) - obese,   Peds  Hematology negative hematology ROS (+)   Anesthesia Other Findings Past Medical History: No date: Anal fissure No date: Arthritis     Comment:  knees No date: BPH (benign prostatic hyperplasia) No date: Chronic insomnia No date: Chronic sinusitis No date: ED (erectile dysfunction) No date: HLD (hyperlipidemia) No date: HTN (hypertension) No date: Hypogonadism in male No date: Measles No date: Mumps No date: Palpitations No date: SVT (supraventricular tachycardia) (HCC)   Reproductive/Obstetrics                             Anesthesia Physical Anesthesia Plan  ASA: II  Anesthesia Plan: General   Post-op Pain Management:    Induction: Intravenous  PONV Risk Score and Plan: 1 and Ondansetron, Midazolam and Propofol infusion  Airway Management Planned: Oral ETT and Video  Laryngoscope Planned  Additional Equipment:   Intra-op Plan:   Post-operative Plan: Extubation in OR  Informed Consent: I have reviewed the patients History and Physical, chart, labs and discussed the procedure including the risks, benefits and alternatives for the proposed anesthesia with the patient or authorized representative who has indicated his/her understanding and acceptance.   Dental advisory given  Plan Discussed with: CRNA and Anesthesiologist  Anesthesia Plan Comments:         Anesthesia Quick Evaluation

## 2017-12-06 NOTE — Op Note (Signed)
Neurosurgery Operative Note  Date of Procedure:  12/06/2017  Location: Main Operating Room 3    Staff: Meryl A. Gertie Exon, M.D.  Procedure:   (1) C6-7 anterior cervical discectomy and fusion     (2) Use of intraoperative fluoroscopy   (3) Use of operating microscope  Preoperative Diagnosis: (1) Left C6-7 herniated intervertebral disc  (2) Left C7 radiculopathy  Postoperative Diagnosis: Same  Complications: None  Indications:   57 yo man with LUE pain in the right C7 distribution.  Imaging shows a left  C6-7 herniated intervertebral disc.  His symptoms have not improved with conservative measures; the nature, risks, and alternatives of the management options were reviewed with the patient and he has elected to proceed with surgical decompression and fusion.  The patient understands the risks include but are not limited to pain, bleeding, infection, paralysis, weakness, numbness, loss of voice, stroke, difficulty swallowing, coma, death, persistence of symptoms, recurrence of symptoms, cerebrospinal fluid leak, need for additional surgery, failure of hardware and/or its fixation.   The patient wishes to proceed; all questions answered in lay terms and detail. Plan is for C6-7 anterior cervical discectomy and fusion; consent signed and on chart.  Description of Procedure: The patient was brought to the operating room and placed supine on the operating room table.  General oroendotracheal anesthesia was induced by the anesthesia service.  All pressure points were padded and he was secured to the OR table with safety straps and tape.  The C6-7 interspace was marked with fluoroscopy.  Neuromonitors were placed for SSEP and MEP monitoring; there were no changes throughout the procedure.  The patient was prepped and draped in the usual sterile fashion.  A timeout was performed, identifying the correct patient, the correct procedure, and the correct location.  The incision was infiltrated with local  anesthetic and opened sharply; a self-retaining retractor was placed.  The platysma was entered sharply and undermined.  The cervical fascia was identified, divided sharply along the medial border of the sternocleidomastoid, undermined, and divided along the medial strap muscles.  The medial border of the SCM was identified.  The omohyoid was identified.  An avascular plane was developed above the omohyoid and medial to the SCM to the level of the prevertebral fascia.  A lipless retractor was used to expose the cervical spine.  Fluoroscopy was used to confirm the level of exposure to be C6-7.  The prevertebral fascia and longus colli were elevated with monopolar cautery.  The retractors were positioned deep to the longus colli; Caspar pins were placed at C6 and C7 in the midline; the Caspar retractor was positioned and opened.  A discectomy was completed using currettes, pituitary and kerrison rongeurs, and the high-speed drill.  The microscope was brought into the field for microdissection.  The disc-osteophyte complex was drilled with the high-speed drill.  The left C6-7 foramen was probed and disc material was noted entering the foramen; a micropituitary was used to remove a long fragment of disc material from the foramen.  The PLL was transected with 39mm and 91mm Kerrison rongeurs.  The foramen were probed bilaterally and confirmed to be decompressed.  Interbody sizing trials were used to select a PEEK interbody graft; a 53mm graft was selected and filled with [place].  It was tapped into position and slightly countersunk using fluoroscopy.  The caspar retractor and pins were removed.  The microscope was brought out of the field.  Anterior osteophytes were removed with the high-speed drill.  A  21 mm Medtronic Zevo plate was positioned, confirmed with fluoroscopy, and secured using 68mm variable screws at C6 and C7.  The locking devices were turned.  The wound was copiously irrigated.  The longus colli were  cauterized with bipolar cautery.  A sponge was placed in the surgical bed and no additional bleeding noted.  2cc's of thrombin was placed in the surgical bed.  The platysma was closed with 3-0 vicryl suture.  The dermis was closed with 3-0 vicryl suture.  The skin was closed with glue.  A sterile dressing was applied.  The patient was extubated, returned to the gurney, and taken to PACU in good condition.  All sponge and needle counts were correct at the end of the procedure.    EBL: 5  UOP: none  Preoperative antibiotic: 1 gm IV vancomycin

## 2017-12-06 NOTE — Transfer of Care (Signed)
Immediate Anesthesia Transfer of Care Note  Patient: Christopher Rose  Procedure(s) Performed: ANTERIOR CERVICAL DECOMPRESSION/DISCECTOMY FUSION 1 LEVEL-C6-7 (N/A Neck)  Patient Location: PACU  Anesthesia Type:General  Level of Consciousness: drowsy and responds to stimulation  Airway & Oxygen Therapy: Patient Spontanous Breathing and Patient connected to face mask oxygen  Post-op Assessment: Report given to RN and Post -op Vital signs reviewed and stable  Post vital signs: Reviewed and stable  Last Vitals:  Vitals Value Taken Time  BP 128/78 12/06/2017 11:35 AM  Temp    Pulse 81 12/06/2017 11:35 AM  Resp 15 12/06/2017 11:35 AM  SpO2 98 % 12/06/2017 11:35 AM    Last Pain:  Vitals:   12/06/17 0611  TempSrc: Tympanic  PainSc: 7       Patients Stated Pain Goal: 3 (29/09/03 0149)  Complications: No apparent anesthesia complications

## 2017-12-06 NOTE — Anesthesia Postprocedure Evaluation (Signed)
Anesthesia Post Note  Patient: MACEN JOSLIN  Procedure(s) Performed: ANTERIOR CERVICAL DECOMPRESSION/DISCECTOMY FUSION 1 LEVEL-C6-7 (N/A Neck)  Patient location during evaluation: PACU Anesthesia Type: General Level of consciousness: awake and alert and oriented Pain management: pain level controlled Vital Signs Assessment: post-procedure vital signs reviewed and stable Respiratory status: spontaneous breathing, nonlabored ventilation and respiratory function stable Cardiovascular status: blood pressure returned to baseline and stable Postop Assessment: no signs of nausea or vomiting Anesthetic complications: no     Last Vitals:  Vitals:   12/06/17 1218 12/06/17 1225  BP: 136/82   Pulse:    Resp:    Temp:  36.6 C  SpO2:      Last Pain:  Vitals:   12/06/17 1225  TempSrc:   PainSc: 4                  Briannie Gutierrez

## 2017-12-06 NOTE — Anesthesia Procedure Notes (Signed)
Procedure Name: Intubation Performed by: Lance Muss, CRNA Pre-anesthesia Checklist: Patient identified, Patient being monitored, Timeout performed, Emergency Drugs available and Suction available Patient Re-evaluated:Patient Re-evaluated prior to induction Oxygen Delivery Method: Circle system utilized Preoxygenation: Pre-oxygenation with 100% oxygen Induction Type: IV induction Ventilation: Mask ventilation without difficulty Laryngoscope Size: 3 and McGraph Grade View: Grade I Tube type: Oral Tube size: 7.5 mm Number of attempts: 1 Airway Equipment and Method: Stylet Placement Confirmation: ETT inserted through vocal cords under direct vision,  positive ETCO2 and breath sounds checked- equal and bilateral Secured at: 22 cm Tube secured with: Tape Dental Injury: Teeth and Oropharynx as per pre-operative assessment  Difficulty Due To: Difficult Airway- due to reduced neck mobility

## 2017-12-06 NOTE — Anesthesia Post-op Follow-up Note (Signed)
Anesthesia QCDR form completed.        

## 2017-12-06 NOTE — Brief Op Note (Signed)
12/06/2017  11:20 AM  PATIENT:  Christopher Rose  57 y.o. male  PRE-OPERATIVE DIAGNOSIS:  CERVICAL RADICULOPATHY  C6-7HERNIATED DISC  POST-OPERATIVE DIAGNOSIS:  CERVICAL RADICULOPATHY  C6-7HERNIATED DISC  PROCEDURE:  Procedure(s): ANTERIOR CERVICAL DECOMPRESSION/DISCECTOMY FUSION 1 LEVEL-C6-7 (N/A)  SURGEON:  Surgeon(s) and Role:    * Bubber Rothert A, MD - Primary  PHYSICIAN ASSISTANT:   ASSISTANTS: Marin Olp, PA-C   ANESTHESIA:   general  EBL:  5 mL   BLOOD ADMINISTERED:none  DRAINS: none   LOCAL MEDICATIONS USED:  LIDOCAINE   SPECIMEN:  No Specimen  DISPOSITION OF SPECIMEN:  N/A  COUNTS:  YES  TOURNIQUET:  * No tourniquets in log *  DICTATION: .Dragon Dictation  PLAN OF CARE: Admit for overnight observation  PATIENT DISPOSITION:  PACU - hemodynamically stable.   Delay start of Pharmacological VTE agent (>24hrs) due to surgical blood loss or risk of bleeding: yes

## 2017-12-06 NOTE — Interval H&P Note (Signed)
History and Physical Interval Note:  12/06/2017 6:59 AM  Christopher Rose  has presented today for surgery, with the diagnosis of CERVICAL RADICULOPATHY  C6-7HERNIATED DISC  The various methods of treatment have been discussed with the patient and family. After consideration of risks, benefits and other options for treatment, the patient has consented to  Procedure(s): ANTERIOR CERVICAL DECOMPRESSION/DISCECTOMY FUSION 1 LEVEL-C6-7 (N/A) as a surgical intervention .  The patient's history has been reviewed, patient examined, no change in status, stable for surgery.  I have reviewed the patient's chart and labs.  Questions were answered to the patient's satisfaction.     [name] A Julian Medina

## 2017-12-06 NOTE — Progress Notes (Signed)
Vancomycin ~15 mg/kg ordered for surgical prophylaxis per consult.   Sim Boast, PharmD, BCPS  12/06/17 6:01 AM

## 2017-12-06 NOTE — H&P (Signed)
[name], MD - 11/23/2017 11:15 AM EST Formatting of this note might be different from the original.   Referring Physician:  [name], [address] [place] [address] [place], [place] [address]  Primary Physician:  [name], MD  DATE: 11/23/2017   RE: Christopher Rose Date of Birth: [birth year] Medical record number: [id number]  Patient Care Team: [name], MD as PCP - General (Family Medicine)  REQUESTED BY: [name], [address], [place] [id number]  REASON FOR VISIT:  [name] had concerns including Follow-up (rk after injection - some relief only the day of the injection. Wants to discuss surgery options).  INTERVAL HISTORY 11/23/2017:  Christopher Rose is a 57 y.o. male who returns today for follow-up. He underwent a left C6-7 transforaminal epidural steroid injection on October 28, 2017 for left C7 radiculopathy from a left C6-7 herniated intervertebral disc. His symptoms did not improve with his injection. He continues to complain of pain that radiates into the suprascapular region as well as the left shoulder. He continues to complain of sensory changes extending down the arm as well as weakness of the tricep. He has attempted physical therapy without improvement. He is not interested in pursuing another injection. He would like to pursue surgical intervention.  HISTORY OF PRESENT ILLNESS 10/19/2017:  Christopher Rose is a 57 y.o. male who presents for neurosurgical evaluation today complaining of left upper extremity pain. His symptoms started in March 2019. Initially this was thought to be a pulled muscle. He was seen by physical therapy and tried oral steroids. When the pain did not improve he was seen by orthopedic surgery who obtained imaging of his cervical spine which demonstrated herniated intervertebral disc. Currently the patient reports the pain radiates from the left neck down the left upper extremity and into the left  tricep as well as dorsum of the forearm and middle digit. In May/June 2019 his symptoms of pain were quite severe however they have much improved since then but plateaued. He is scheduled for a cervical epidural steroid injection on October 28, 2017. No incontinence. He did complete physical therapy for a total of 5 sessions. He continues to perform elastiband exercises at home.  PAST MEDICAL HISTORY:  Past Medical History:  Diagnosis Date  . ED (erectile dysfunction)  . 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 Hx of colonic polyps  . Hypercholesteremia  . Hypertension  . Hypogonadism in male 11/12/2014  . Insomnia  . Internal hemorrhoids 09/20/2014  . SVT (supraventricular tachycardia) (CMS-HCC) 06/26/2008   PAST SURGICAL HISTORY:  Past Surgical History:  Procedure Laterality Date  . cardiac ablation 2006 or 2007   CURRENT MEDICATIONS:   Current Outpatient Medications:  . clomiPHENE (CLOMID) 50 mg tablet, Take 1/2 tablet daily, Disp: , Rfl:  . lisinopril (PRINIVIL,ZESTRIL) 10 MG tablet, Take 10 mg by mouth once daily, Disp: , Rfl:  . meloxicam (MOBIC) 15 MG tablet, Take 15 mg by mouth , Disp: , Rfl:  . tadalafil (CIALIS) 20 MG tablet, take 1 tablet by mouth if needed, Disp: , Rfl:  . zolpidem (AMBIEN) 10 mg tablet, take 1 tablet by mouth at bedtime if needed, Disp: , Rfl:   ALLERGIES:  Allergies  Allergen Reactions  . Penicillins Rash  . Amoxicillin Rash   SOCIAL HISTORY: Social History   Socioeconomic History  . Marital status: Married  Spouse name: Not on file  . Number of children: Not on file  . Years of education:  Not on file  . Highest education level: Not on file  Occupational History  . Not on file  Social Needs  . Financial resource strain: Not on file  . Food insecurity:  Worry: Not on file  Inability: Not on file  . Transportation needs:  Medical: Not on file  Non-medical: Not on file  Tobacco Use  . Smoking status: Never Smoker  . Smokeless tobacco: Never Used   Substance and Sexual Activity  . Alcohol use: Yes  . Drug use: Never  . Sexual activity: Defer  Other Topics Concern  . Not on file  Social History Narrative  . Not on file   He reports that he has never smoked. He has never used smokeless tobacco. He reports current alcohol use. He reports that he does not use drugs. Married [2]  Social History   Patient does not qualify to have social determinant information on file (likely too young).  Social History Narrative  . Not on file   FAMILY MEDICAL HISTORY:  his family history includes No Known Problems in his mother.  REVIEW OF SYSTEMS: Negative unless checked General Cardiovascular Neurological  Recent fever/chills []  Calf pain with walking []  Confusion []   Recent weight loss []  Chest pain []  Memory loss []   Skin Leg swelling []  Dizziness []   Bruising []  Irregular Heartbeat []  Fainting []   Rash []  Gastrointestinal Headaches []   Eyes/ Ears / Nose / Mouth / Throat Abdominal pain []  Balance problems []   Mouth sores []  Blood in stools []  Numbness/tingling [x]   Change/blurring vision []  Diarrhea []  Seizures []   Double vision []  Nausea []  Psychiatric  Ear infections []  Vomiting []  Anxiety []   Hearing loss []  Genitourinary Depression []   Ringing in ears []  Painful urination []  Endocrine  Vertigo []  Blood in urine []  Excessive thirst []   Nasal Congestion []  Incontinence []  Cold/heat intolerance []   Recent sore throat []  Musculoskeletal Low blood sugar []   Respiratory Joint pain or swelling []  Hormone replacement []   Cough []  Muscle pain [x]  Steroid use []   Shortness of breath []  Hematologic  Excessive bleeding []    PHYSICAL EXAM: BP 120/87  Pulse 98  Ht 175.3 cm (5\' 9" )  Wt 88.5 kg (195 lb 3.2 oz)  BMI 28.83 kg/m  Vitals:  11/23/17 1127  BP: 120/87  Pulse: 98   GENERAL: Well-developed well-nourished, in no apparent distress.  MENTAL STATUS: AAOx3  CV: RRR nl S1S2 RESP: CTAB DERM: no skin changes or rashes.    MOTOR: 5/5 except left tricep 4/5 and left grip 5-/5 Negative pronator drift.  SENSATION:  Hypesthesia: Left deltoid, thumb, and middle digit  Hypalgesia: Left deltoid, thumb, and middle digit  DTRs:  2+ and symmetric throughout  Hoffman's sign: negative  GAIT:  Casual gait: Intact; no antalgia   MEDICAL DECISION MAKING  IMAGING: MRI of the cervical spine was reviewed in detail with the patient. This shows a left paracentral disc protrusion at C6-7 with compression of the exiting left C7 nerve root.  IMPRESSION: Large left subarticular/foraminal disc protrusion at C6-7, severely narrowing the left neural foramen and impinging on the C7 nerve root. Mild spinal canal stenosis with moderate indentation of the left ventral aspect of the spinal cord without cord signal change.   ASSESSMENT/PLAN: 57 year old gentleman with left C7 radiculopathy due to left C6-7 herniated intervertebral disc.  Of my 21 minutes spent face to face with the patient, over 50% was spent counseling patient on complex neurologic system disease. This included discussion of  how diagnosis is made, review of pertinent imaging studies and examination findings, as well as scientific basis for the differential diagnosis. In addition, the patient was counseled about medical and surgical options for treatment. Included in this discussion were the indications for treatment, alternative treatments available, likelihood of success, risks associated with all treatment options, and long-term prognosis. All questions were answered, and the patient expressed understanding of the prescribed plan. No barriers to comprehension.  1. The patient would like to proceed with C6-7 ACDF. The patient understands the risks include but are not limited to pain, bleeding, infection, coma, stroke, death, paralysis, weakness, numbness, neurovascular injury,persistence of symptoms, recurrence of symptoms, loss of voice, difficulty swallowing,  myocardial infarction, deep venous thrombosis, pulmonary embolism, failure of hardware and/or its fixation, blindness, cerebrospinal fluid leak, need for additional surgical procedures in either the short- or long-term.. The patient wishes to proceed; all questions answered in lay terms and detail. Consent signed and on chart.  Plan for December 06, 2017. Consent signed and on chart.  DIAGNOSIS: Cervical radiculopathy at C7 (primary encounter diagnosis)  Cervical herniated disc   Jaci Carrel III, MD Dept. of Neurosurgery  This note was dictated using voice recognition software. Please contact me if there are any questions regarding its content.  Electronically signed by [name], MD at 11/23/2017 12:21 PM EST

## 2017-12-07 ENCOUNTER — Observation Stay: Payer: BLUE CROSS/BLUE SHIELD

## 2017-12-07 ENCOUNTER — Encounter: Payer: Self-pay | Admitting: Neurological Surgery

## 2017-12-07 DIAGNOSIS — Z79899 Other long term (current) drug therapy: Secondary | ICD-10-CM | POA: Diagnosis not present

## 2017-12-07 DIAGNOSIS — M50123 Cervical disc disorder at C6-C7 level with radiculopathy: Secondary | ICD-10-CM | POA: Diagnosis not present

## 2017-12-07 DIAGNOSIS — E7800 Pure hypercholesterolemia, unspecified: Secondary | ICD-10-CM | POA: Diagnosis not present

## 2017-12-07 DIAGNOSIS — M4322 Fusion of spine, cervical region: Secondary | ICD-10-CM | POA: Diagnosis not present

## 2017-12-07 DIAGNOSIS — Z791 Long term (current) use of non-steroidal anti-inflammatories (NSAID): Secondary | ICD-10-CM | POA: Diagnosis not present

## 2017-12-07 DIAGNOSIS — I10 Essential (primary) hypertension: Secondary | ICD-10-CM | POA: Diagnosis not present

## 2017-12-07 MED ORDER — METHOCARBAMOL 500 MG PO TABS: 500.0000 mg | ORAL_TABLET | Freq: Four times a day (QID) | ORAL | 0 refills | Status: DC | PRN

## 2017-12-07 MED ORDER — OXYCODONE HCL 5 MG PO TABS: 5.0000 mg | ORAL_TABLET | ORAL | 0 refills | Status: DC | PRN

## 2017-12-07 NOTE — Progress Notes (Signed)
Neurosurgery Progress Note - [facility] [id number] 12/06/2017  ACTIVE PROBLEMS: Active Problems:   Cervical radiculopathy   ASSESSMENT/PLAN POD#1 s/p C6-7 ACDF- doing well  1. D/c home today   This note was dictated using voice recognition software.  Please contact me if there are any questions regarding its content.  Electronically signed by: Alexander Bergeron 12/07/2017 7:48 AM   ______________________________________________________________________ SUBJECTIVE  Interval History: left hand numbness improved; no difficulty with voice or swallow; would like to go home   Scheduled Meds: . clomiPHENE  25 mg Oral Daily  . lisinopril  10 mg Oral Daily  . metoprolol succinate  50 mg Oral Daily  . zolpidem  10 mg Oral QHS     Continuous Infusions: . methocarbamol (ROBAXIN) IV       PRN Meds:acetaminophen **OR** acetaminophen, HYDROmorphone (DILAUDID) injection, hydrOXYzine **OR** hydrOXYzine, menthol-cetylpyridinium **OR** phenol, methocarbamol **OR** methocarbamol (ROBAXIN) IV, oxyCODONE, oxyCODONE, senna-docusate    OBJECTIVE  Vitals Current Average / Min / Max     Temp    98.2 F (36.8 C)    Temp  Min: 97.1 F (36.2 C)  Max: 98.9 F (37.2 C)     BP     103/65     BP  Min: 103/65  Max: 136/82     HR    63    Pulse  Avg: 78.5  Min: 62  Max: 100     RR    18    Resp  Avg: 17.6  Min: 15  Max: 19     Sats    96 %    SpO2  Min: 93 %  Max: 98 %     Weight    87.5 kg    Admit: 87.5 kg   Intake/Output last 3 shifts: I/O last 3 completed shifts: In: 1094.2 [I.V.:1094.2] Out: 10 [Blood:10]   Examination: Motor: left tricep 5-/5, all others 5/5 Sens: intact to LT Dressing c/d/i

## 2017-12-07 NOTE — Discharge Summary (Signed)
Procedure: C6-7 ACDF Procedure date: 12/06/2017 Diagnosis: Cervical Radiculopathy  History: GERHARDT Rose is POD1 s/p C6-7 ACDF. He is recovering well. Pain symptoms in left upper extremity have resolve and numbness continues to improve following procedure. Complains of upper chest soreness. Mild issues with swallowing, able to tolerate liquid and soft food. Ambulated and voided without issue.  Xrays completed. Pain adequately controlled with post op medications.   Physical Exam: Vitals:   12/07/17 0517 12/07/17 0732  BP: 115/81 103/65  Pulse: 62 63  Resp: 18   Temp: 98 F (36.7 C) 98.2 F (36.8 C)  SpO2: 98% 96%    AA Ox3 Strength:5/5 throughout upper and lower except 4+/5 left grip Sensation: intact and symmetric upper and lower extremities.  Skin: incision site dressing clean and dry.   Data:  Recent Labs  Lab 11/30/17 1309  NA 142  K 4.0  CL 104  CO2 29  BUN 13  CREATININE 1.02  GLUCOSE 87  CALCIUM 9.2   No results for input(s): AST, ALT, ALKPHOS in the last 168 hours.  Invalid input(s): TBILI   Recent Labs  Lab 11/30/17 1309  WBC 9.2  HGB 15.0  HCT 42.9  PLT 228   Recent Labs  Lab 11/30/17 1309  APTT 29  INR 1.00         Other tests/results: EXAM: CERVICAL SPINE - 2 VIEW  COMPARISON:  12/06/2017  FINDINGS: Cervical fusion is again noted at C6-7. No hardware abnormality is noted. Osteophytic changes are noted arising from the inferior aspect of C5. No acute fracture or acute facet abnormality is noted.  IMPRESSION: Status post C6-7 fusion   Electronically Signed   By: Inez Catalina M.D.   On: 12/07/2017 07:05  Assessment/Plan:  Christopher Rose is POD1 s/p C6-7 ACDF. He is recovering well and pain symptoms prior to surgery have resolved. Will continue pain control with tylenol, robaxin, and oxycodone as needed. He is scheduled to follow up in clinic in 2 weeks to monitor progress. Advised to contact office if any questions or concerns  arise.   Marin Olp PA-C Department of Neurosurgery

## 2017-12-07 NOTE — Progress Notes (Signed)
Discharge instructions and prescription given to pt. IVs removed. Pt getting dressed and will discharge home with wife.

## 2017-12-07 NOTE — Care Management (Signed)
No RNCM needs noted. Please contact Levada Dy at [phone] if needed.

## 2017-12-07 NOTE — Discharge Instructions (Signed)

## 2018-01-12 DIAGNOSIS — Z981 Arthrodesis status: Secondary | ICD-10-CM | POA: Diagnosis not present

## 2018-01-12 DIAGNOSIS — M5020 Other cervical disc displacement, unspecified cervical region: Secondary | ICD-10-CM | POA: Diagnosis not present

## 2018-01-19 DIAGNOSIS — Z981 Arthrodesis status: Secondary | ICD-10-CM | POA: Diagnosis not present

## 2018-01-21 ENCOUNTER — Other Ambulatory Visit: Payer: Self-pay | Admitting: Family Medicine

## 2018-01-21 NOTE — Telephone Encounter (Signed)
Pharmacy requesting refills. Thanks!  

## 2018-01-26 DIAGNOSIS — Z981 Arthrodesis status: Secondary | ICD-10-CM | POA: Diagnosis not present

## 2018-02-02 DIAGNOSIS — Z981 Arthrodesis status: Secondary | ICD-10-CM | POA: Diagnosis not present

## 2018-02-09 DIAGNOSIS — Z981 Arthrodesis status: Secondary | ICD-10-CM | POA: Diagnosis not present

## 2018-02-15 ENCOUNTER — Other Ambulatory Visit: Payer: Self-pay

## 2018-02-15 ENCOUNTER — Other Ambulatory Visit: Payer: BLUE CROSS/BLUE SHIELD

## 2018-02-15 DIAGNOSIS — E349 Endocrine disorder, unspecified: Secondary | ICD-10-CM

## 2018-02-17 DIAGNOSIS — Z981 Arthrodesis status: Secondary | ICD-10-CM | POA: Diagnosis not present

## 2018-02-17 LAB — HEMOGLOBIN: Hemoglobin: 14.6 g/dL (ref 13.0–17.7)

## 2018-02-17 LAB — HEPATIC FUNCTION PANEL
ALT: 34 IU/L (ref 0–44)
AST: 28 IU/L (ref 0–40)
Albumin: 4.3 g/dL (ref 3.8–4.9)
Alkaline Phosphatase: 112 IU/L (ref 39–117)
Bilirubin Total: 0.5 mg/dL (ref 0.0–1.2)
Bilirubin, Direct: 0.22 mg/dL (ref 0.00–0.40)
TOTAL PROTEIN: 6.2 g/dL (ref 6.0–8.5)

## 2018-02-17 LAB — HEMATOCRIT: Hematocrit: 42.5 % (ref 37.5–51.0)

## 2018-02-17 LAB — PSA: Prostate Specific Ag, Serum: 1.7 ng/mL (ref 0.0–4.0)

## 2018-02-17 LAB — TESTOSTERONE: Testosterone: 844 ng/dL (ref 264–916)

## 2018-02-22 ENCOUNTER — Encounter: Payer: Self-pay | Admitting: Urology

## 2018-02-22 ENCOUNTER — Ambulatory Visit: Payer: BLUE CROSS/BLUE SHIELD | Admitting: Urology

## 2018-02-22 VITALS — BP 126/78 | HR 89 | Ht 69.0 in | Wt 195.2 lb

## 2018-02-22 DIAGNOSIS — N401 Enlarged prostate with lower urinary tract symptoms: Secondary | ICD-10-CM | POA: Diagnosis not present

## 2018-02-22 DIAGNOSIS — E349 Endocrine disorder, unspecified: Secondary | ICD-10-CM | POA: Diagnosis not present

## 2018-02-22 DIAGNOSIS — N529 Male erectile dysfunction, unspecified: Secondary | ICD-10-CM

## 2018-02-22 DIAGNOSIS — N138 Other obstructive and reflux uropathy: Secondary | ICD-10-CM | POA: Diagnosis not present

## 2018-02-22 NOTE — Progress Notes (Signed)
02/22/2018 8:49 AM   [place] 03-23-1960 [id number]  Referring provider: Jerrol Banana., MD [address] [place], [place] [address]  Chief Complaint  Patient presents with  . Hypogonadism    HPI: Christopher Rose is a 58 y.o. male Caucasian with testosterone deficiency, erectile dysfunction and BPH with LUTS who presents today for a 6 month follow up.   Patient today (02/22/2018) is status post spinal surgery on 12/2017 from injuries from playing golf and is doing much better.  This may have helped his bloodwork.  Testosterone deficiency He is currently managing his testosterone deficiency with Clomid 50 mg, 1/2 daily.  His most recent testosterone level was 844 ng/dL on 02/15/2018.  Hbg, HCT and LFT's are normal.  Erectile dysfunction His SHIM score is 22 which is no ED.   His previous SHIM score was 24.  His risk factors for ED are age, BPH, hypogonadism, and HTN.  He denies any painful erections or curvatures with his erections.  He has tried Cialis 20 mg, 1/2 tablet in the past with adequate results.  SHIM    Row Name 02/22/18 [phone]         SHIM: Over the last 6 months:   How do you rate your confidence that you could get and keep an erection?  Moderate     When you had erections with sexual stimulation, how often were your erections hard enough for penetration (entering your partner)?  Almost Always or Always     During sexual intercourse, how often were you able to maintain your erection after you had penetrated (entered) your partner?  Almost Always or Always     During sexual intercourse, how difficult was it to maintain your erection to completion of intercourse?  Slightly Difficult     When you attempted sexual intercourse, how often was it satisfactory for you?  Almost Always or Always       SHIM Total Score   SHIM  22       Score: 1-7 Severe ED 8-11 Moderate ED 12-16 Mild-Moderate ED 17-21 Mild ED 22-25 No ED  BPH WITH LUTS  (prostate  and/or bladder) IPSS score: 4/0  Previous score: 3/0  Denies any dysuria, hematuria or suprapubic pain.  Denies any recent fevers, chills, nausea or vomiting.  He does not have a family history of PCa.  IPSS    Row Name 02/22/18 0800         International Prostate Symptom Score   How often have you had the sensation of not emptying your bladder?  Not at All     How often have you had to urinate less than every two hours?  Not at All     How often have you found you stopped and started again several times when you urinated?  Less than 1 in 5 times     How often have you found it difficult to postpone urination?  Not at All     How often have you had a weak urinary stream?  Not at All     How often have you had to strain to start urination?  Not at All     How many times did you typically get up at night to urinate?  3 Times     Total IPSS Score  4       Quality of Life due to urinary symptoms   If you were to spend the rest of your life  with your urinary condition just the way it is now how would you feel about that?  Delighted       Score:  1-7 Mild 8-19 Moderate 20-35 Severe  PMH: Past Medical History:  Diagnosis Date  . Anal fissure   . Arthritis    knees  . BPH (benign prostatic hyperplasia)   . Chronic insomnia   . Chronic sinusitis   . ED (erectile dysfunction)   . HLD (hyperlipidemia)   . HTN (hypertension)   . Hypogonadism in male   . Measles   . Mumps   . Palpitations   . SVT (supraventricular tachycardia) [facility])     Surgical History: Past Surgical History:  Procedure Laterality Date  . ANTERIOR CERVICAL DECOMP/DISCECTOMY FUSION N/A 12/06/2017   Procedure: ANTERIOR CERVICAL DECOMPRESSION/DISCECTOMY FUSION 1 LEVEL-C6-7;  Surgeon: Kathlene November, MD;  Location: [facility];  Service: Neurosurgery;  Laterality: N/A;  . cardiac ablation surgery  2010  . COLONOSCOPY WITH PROPOFOL N/A 12/02/2015   Procedure: COLONOSCOPY WITH PROPOFOL;  Surgeon: Lucilla Lame,  MD;  Location: [place];  Service: Endoscopy;  Laterality: N/A;  . HERNIA REPAIR  1990  . POLYPECTOMY  12/02/2015   Procedure: POLYPECTOMY;  Surgeon: Lucilla Lame, MD;  Location: [place];  Service: Endoscopy;;  . VASECTOMY  1992    Home Medications:  Allergies as of 02/22/2018      Reactions   Amoxicillin Rash   Penicillins Rash, Other (See Comments)   Has patient had a PCN reaction causing immediate rash, facial/tongue/throat swelling, SOB or lightheadedness with hypotension: No Has patient had a PCN reaction causing severe rash involving mucus membranes or skin necrosis: No Has patient had a PCN reaction that required hospitalization: No Has patient had a PCN reaction occurring within the last 10 years: No If all of the above answers are "NO", then may proceed with Cephalosporin use.      Medication List       Accurate as of February 22, 2018  8:49 AM. Always use your most recent med list.        clomiPHENE 50 MG tablet Commonly known as:  CLOMID Take 1/2 tablet daily   lisinopril 10 MG tablet Commonly known as:  PRINIVIL,ZESTRIL Take 1 tablet (10 mg total) by mouth daily.   meloxicam 15 MG tablet Commonly known as:  MOBIC Take 1 tablet (15 mg total) by mouth daily as needed for pain.   tadalafil 20 MG tablet Commonly known as:  CIALIS take 1 tablet by mouth if needed   zolpidem 10 MG tablet Commonly known as:  AMBIEN TAKE 1 TABLET BY MOUTH EVERYDAY AT BEDTIME       Allergies:  Allergies  Allergen Reactions  . Amoxicillin Rash  . Penicillins Rash and Other (See Comments)    Has patient had a PCN reaction causing immediate rash, facial/tongue/throat swelling, SOB or lightheadedness with hypotension: No Has patient had a PCN reaction causing severe rash involving mucus membranes or skin necrosis: No Has patient had a PCN reaction that required hospitalization: No Has patient had a PCN reaction occurring within the last 10 years: No If all  of the above answers are "NO", then may proceed with Cephalosporin use.     Family History: Family History  Problem Relation Age of Onset  . Diabetes Mellitus II Mother   . Colon cancer Paternal Grandfather   . Heart attack Father   . Kidney disease Neg Hx   . Prostate cancer Neg Hx   .  Bladder Cancer Neg Hx   . Kidney cancer Neg Hx     Social History:  reports that he has never smoked. He has never used smokeless tobacco. He reports current alcohol use. He reports that he does not use drugs.  ROS: UROLOGY Frequent Urination?: No Hard to postpone urination?: No Burning/pain with urination?: No Get up at night to urinate?: No Leakage of urine?: No Urine stream starts and stops?: No Trouble starting stream?: No Do you have to strain to urinate?: No Blood in urine?: No Urinary tract infection?: No Sexually transmitted disease?: No Injury to kidneys or bladder?: No Painful intercourse?: No Weak stream?: No Erection problems?: No Penile pain?: No  Gastrointestinal Nausea?: No Vomiting?: No Indigestion/heartburn?: No Diarrhea?: No Constipation?: No  Constitutional Fever: No Night sweats?: No Weight loss?: No Fatigue?: No  Skin Skin rash/lesions?: No Itching?: No  Eyes Blurred vision?: No Double vision?: No  Ears/Nose/Throat Sore throat?: No Sinus problems?: No  Hematologic/Lymphatic Swollen glands?: No Easy bruising?: No  Cardiovascular Leg swelling?: No Chest pain?: No  Respiratory Cough?: No Shortness of breath?: No  Endocrine Excessive thirst?: No  Musculoskeletal Back pain?: No Joint pain?: No  Neurological Headaches?: No Dizziness?: No  Psychologic Depression?: No Anxiety?: No  Physical Exam: BP 126/78 (BP Location: Left Arm, Patient Position: Sitting, Cuff Size: Normal)   Pulse 89   Ht 5\' 9"  (1.753 m)   Wt 195 lb 3.2 oz (88.5 kg)   BMI 28.83 kg/m   Constitutional:  Well nourished. Alert and oriented, No acute  distress. Cardiovascular: No clubbing, cyanosis, or edema. Respiratory: Normal respiratory effort, no increased work of breathing. GU: No CVA tenderness.  No bladder fullness or masses.  Patient with circumcised phallus.  Urethral meatus is patent.  No penile discharge. No penile lesions or rashes. Scrotum without lesions, cysts, rashes and/or edema.  Testicles are located scrotally bilaterally. No masses are appreciated in the testicles. Left and right epididymis are normal. Rectal: Patient with normal sphincter tone.  Anus and perineum without scarring or rashes.  No rectal masses are appreciated. Prostate is approximately 55 grams, could only palpate the apex and midportion, no nodules are appreciated.  Seminal vesicles could not be palpated. Skin: No rashes, bruises or suspicious lesions. Neurologic: Grossly intact, no focal deficits, moving all 4 extremities. Psychiatric: Normal mood and affect.  Laboratory Data:  Results for orders placed or performed in visit on 02/15/18  Testosterone  Result Value Ref Range   Testosterone 844 264 - 916 ng/dL  Hemoglobin  Result Value Ref Range   Hemoglobin 14.6 13.0 - 17.7 g/dL  Hematocrit  Result Value Ref Range   Hematocrit 42.5 37.5 - 51.0 %  Hepatic function panel  Result Value Ref Range   Total Protein 6.2 6.0 - 8.5 g/dL   Albumin 4.3 3.8 - 4.9 g/dL   Bilirubin Total 0.5 0.0 - 1.2 mg/dL   Bilirubin, Direct 0.22 0.00 - 0.40 mg/dL   Alkaline Phosphatase 112 39 - 117 IU/L   AST 28 0 - 40 IU/L   ALT 34 0 - 44 IU/L  PSA  Result Value Ref Range   Prostate Specific Ag, Serum 1.7 0.0 - 4.0 ng/mL   Lab Results  Component Value Date   WBC 9.2 11/30/2017   HGB 14.6 02/15/2018   HCT 42.5 02/15/2018   MCV 88.5 11/30/2017   PLT 228 11/30/2017    Lab Results  Component Value Date   CREATININE 1.02 11/30/2017   Lipid Panel  Component Value Date/Time   CHOL 207 (A) 06/09/2017   CHOL 220 (H) 10/03/2015 0949   TRIG 131 06/09/2017    HDL 47 06/09/2017   HDL 45 10/03/2015 0949   LDLCALC 134 06/09/2017   LDLCALC 143 (H) 10/03/2015 0949    Lab Results  Component Value Date   AST 28 02/15/2018   Lab Results  Component Value Date   ALT 34 02/15/2018   I reviewed the labs  Assessment & Plan:    1. Testosterone deficiency: - Most recent testosterone level is 844 ng/dL on 02/15/2018 (therapeutic levels 450-600) - Continue Clomid 50 mg, 1/2 tablet daily - RTC in 1 year for HCT/HBG, testosterone, PSA, LFT's, ADAM and exam  2. BPH with LUTS - IPSS score is 5/0, it is slightly worse - Continue conservative management, avoiding bladder irritants and timed voiding's - RTC in 1 year for IPSS, PSA and exam, as testosterone therapy can cause prostate enlargement and worsen LUTS  3. Erectile dysfunction:    - SHIM score is 22, it is worsening - Continue Cialis - RTC in 1 year for SHIM score and exam, as testosterone therapy can affect erections  Return in about 1 year (around 02/23/2019) for PSA, LFT's, HCT, HBG, testosterone (before 10 AM), IPSS, SHIM and exam.  Zara Council, PA-C  [facility] [address] [place], [place] [address] [phone]  I, Adele Schilder, am acting as a Education administrator for [employer], PA-C.   I have reviewed the above documentation for accuracy and completeness, and I agree with the above.    Zara Council, PA-C

## 2018-02-24 DIAGNOSIS — M5412 Radiculopathy, cervical region: Secondary | ICD-10-CM | POA: Diagnosis not present

## 2018-02-24 DIAGNOSIS — Z981 Arthrodesis status: Secondary | ICD-10-CM | POA: Diagnosis not present

## 2018-03-22 ENCOUNTER — Other Ambulatory Visit: Payer: Self-pay | Admitting: Urology

## 2018-05-15 ENCOUNTER — Other Ambulatory Visit: Payer: Self-pay | Admitting: Family Medicine

## 2018-05-15 DIAGNOSIS — I10 Essential (primary) hypertension: Secondary | ICD-10-CM

## 2018-05-16 ENCOUNTER — Other Ambulatory Visit: Payer: Self-pay | Admitting: Family Medicine

## 2018-05-16 DIAGNOSIS — G4700 Insomnia, unspecified: Secondary | ICD-10-CM

## 2018-05-18 NOTE — Telephone Encounter (Signed)
Please Review

## 2018-05-20 ENCOUNTER — Other Ambulatory Visit: Payer: Self-pay | Admitting: Family Medicine

## 2018-05-20 DIAGNOSIS — G4700 Insomnia, unspecified: Secondary | ICD-10-CM

## 2018-05-20 NOTE — Telephone Encounter (Signed)
Please review

## 2018-06-22 ENCOUNTER — Other Ambulatory Visit: Payer: Self-pay | Admitting: Physician Assistant

## 2018-06-22 DIAGNOSIS — G4700 Insomnia, unspecified: Secondary | ICD-10-CM

## 2018-06-23 NOTE — Telephone Encounter (Signed)
Please review

## 2018-07-18 ENCOUNTER — Other Ambulatory Visit: Payer: Self-pay | Admitting: Family Medicine

## 2018-07-23 ENCOUNTER — Encounter: Payer: Self-pay | Admitting: Emergency Medicine

## 2018-07-23 ENCOUNTER — Other Ambulatory Visit: Payer: Self-pay

## 2018-07-23 ENCOUNTER — Emergency Department
Admission: EM | Admit: 2018-07-23 | Discharge: 2018-07-23 | Disposition: A | Discharge status: Home / Self Care | Payer: BC Managed Care – PPO | Attending: Emergency Medicine | Admitting: Emergency Medicine

## 2018-07-23 DIAGNOSIS — Z5321 Procedure and treatment not carried out due to patient leaving prior to being seen by health care provider: Secondary | ICD-10-CM | POA: Diagnosis not present

## 2018-07-23 DIAGNOSIS — M545 Low back pain: Secondary | ICD-10-CM | POA: Diagnosis not present

## 2018-07-23 NOTE — ED Triage Notes (Signed)
Low back pain x 4 days. Does not radiate to either leg. Denies fall or injury at onset.

## 2018-07-23 NOTE — ED Notes (Signed)
Pt called for a second time to be roomed with no answer.

## 2018-07-23 NOTE — ED Notes (Signed)
Pt called to be roomed with no answer.  

## 2018-08-23 ENCOUNTER — Encounter: Payer: Self-pay | Admitting: Family Medicine

## 2018-08-25 DIAGNOSIS — Z981 Arthrodesis status: Secondary | ICD-10-CM | POA: Diagnosis not present

## 2018-08-25 DIAGNOSIS — M4802 Spinal stenosis, cervical region: Secondary | ICD-10-CM | POA: Diagnosis not present

## 2018-08-25 DIAGNOSIS — M5412 Radiculopathy, cervical region: Secondary | ICD-10-CM | POA: Diagnosis not present

## 2018-10-26 ENCOUNTER — Encounter: Payer: Self-pay | Admitting: Family Medicine

## 2018-11-21 ENCOUNTER — Other Ambulatory Visit: Payer: Self-pay | Admitting: Family Medicine

## 2018-11-22 NOTE — Telephone Encounter (Signed)
Requested medication (s) are due for refill today: no  Requested medication (s) are on the active medication list: yes  Last refill: 11/11/2018  Future visit scheduled: yes  Notes to clinic:  Review for refill   Requested Prescriptions  Pending Prescriptions Disp Refills   meloxicam (MOBIC) 15 MG tablet [Pharmacy Med Name: MELOXICAM 15 MG TABLET] 90 tablet 1    Sig: TAKE 1 TABLET BY MOUTH EVERY DAY AS NEEDED FOR PAIN     Analgesics:  COX2 Inhibitors Failed - 11/21/2018  9:10 PM      Failed - Valid encounter within last 12 months    Recent Outpatient Visits          1 year ago Essential hypertension   [facility] Jerrol Banana., MD   1 year ago Upper back strain, initial encounter   [facility] Jerrol Banana., MD   1 year ago Thoracic myofascial strain, initial encounter   [facility] Jerrol Banana., MD   2 years ago Annual physical exam   [facility] Jerrol Banana., MD   2 years ago Viral URI with cough   [place], [place]      Future Appointments            In 3 months McGowan, Hunt Oris, PA-C [facility]           Passed - HGB in normal range and within 360 days    Hemoglobin  Date Value Ref Range Status  02/15/2018 14.6 13.0 - 17.7 g/dL Final         Passed - Cr in normal range and within 360 days    Creatinine, Ser  Date Value Ref Range Status  11/30/2017 1.02 0.61 - 1.24 mg/dL Final         Passed - Patient is not pregnant

## 2018-11-22 NOTE — Telephone Encounter (Signed)
From [facility], please review 

## 2018-12-07 NOTE — Progress Notes (Signed)
Patient: Christopher Rose, Male    DOB: [birth year], 59 y.o.   MRN: [id number] Visit Date: 12/08/2018  Today's Provider: Wilhemena Durie, MD   Chief Complaint  Patient presents with  . Annual Exam   Subjective:     Annual physical exam Christopher Rose is a 58 y.o. male who presents today for health maintenance and complete physical. He feels well. He reports exercising playing golf. He reports he is sleeping fairly well. He is feeling well.  He is recovered nicely from neck surgery  .  He is married he has a son and a daughter, grandchild from his daughter and his son is recently engaged. -----------------------------------------------------------------  Colonoscopy: 12/02/2015  Review of Systems  HENT: Positive for sinus pressure. Negative for congestion, facial swelling, hearing loss, rhinorrhea, sinus pain, sore throat, trouble swallowing and voice change.   Eyes: Negative.   Respiratory: Negative for cough and shortness of breath.   Cardiovascular: Negative for chest pain and palpitations.  Gastrointestinal: Negative.   Endocrine: Negative.   Musculoskeletal: Negative.   Skin: Negative.   Allergic/Immunologic: Negative.   Hematological: Negative.   Psychiatric/Behavioral: Negative.     Social History      He  reports that he has never smoked. He has never used smokeless tobacco. He reports current alcohol use. He reports that he does not use drugs.       Social History   Socioeconomic History  . Marital status: Married    Spouse name: Not on file  . Number of children: Not on file  . Years of education: Not on file  . Highest education level: Not on file  Occupational History  . Not on file  Social Needs  . Financial resource strain: Not on file  . Food insecurity    Worry: Not on file    Inability: Not on file  . Transportation needs    Medical: Not on file    Non-medical: Not on file  Tobacco Use  . Smoking status: Never Smoker  . Smokeless tobacco:  Never Used  Substance and Sexual Activity  . Alcohol use: Yes    Alcohol/week: 0.0 standard drinks    Comment: occasional - holidays  . Drug use: No  . Sexual activity: Yes  Lifestyle  . Physical activity    Days per week: Not on file    Minutes per session: Not on file  . Stress: Not on file  Relationships  . Social Herbalist on phone: Not on file    Gets together: Not on file    Attends religious service: Not on file    Active member of club or organization: Not on file    Attends meetings of clubs or organizations: Not on file    Relationship status: Not on file  Other Topics Concern  . Not on file  Social History Narrative  . Not on file    Past Medical History:  Diagnosis Date  . Anal fissure   . Arthritis    knees  . BPH (benign prostatic hyperplasia)   . Chronic insomnia   . Chronic sinusitis   . ED (erectile dysfunction)   . HLD (hyperlipidemia)   . HTN (hypertension)   . Hypogonadism in male   . Measles   . Mumps   . Palpitations   . SVT (supraventricular tachycardia) [facility])      Patient Active Problem List   Diagnosis Date Noted  . Cervical  radiculopathy 12/06/2017  . Cervical spondylitis with radiculitis ([place]) 08/06/2017  . Hx of colonic polyps   . Benign neoplasm of descending colon   . Insomnia 03/27/2015  . Erectile dysfunction of organic origin 02/13/2015  . BPH with obstruction/lower urinary tract symptoms 02/13/2015  . Hypogonadism in male 11/12/2014  . Insomnia, persistent 09/20/2014  . ED (erectile dysfunction) of organic origin 09/20/2014  . History of prolonged Q-T interval on ECG 09/20/2014  . Internal hemorrhoids 09/20/2014  . Hypercholesteremia 09/20/2014  . BP (high blood pressure) 09/20/2014  . 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 SVT (supraventricular tachycardia) ([place]) 06/26/2008  . DIZZINESS 06/26/2008    Past Surgical History:  Procedure Laterality Date  . ANTERIOR CERVICAL DECOMP/DISCECTOMY FUSION N/A 12/06/2017    Procedure: ANTERIOR CERVICAL DECOMPRESSION/DISCECTOMY FUSION 1 LEVEL-C6-7;  Surgeon: Kathlene November, MD;  Location: [facility];  Service: Neurosurgery;  Laterality: N/A;  . cardiac ablation surgery  2010  . COLONOSCOPY WITH PROPOFOL N/A 12/02/2015   Procedure: COLONOSCOPY WITH PROPOFOL;  Surgeon: Lucilla Lame, MD;  Location: [place];  Service: Endoscopy;  Laterality: N/A;  . HERNIA REPAIR  1990  . POLYPECTOMY  12/02/2015   Procedure: POLYPECTOMY;  Surgeon: Lucilla Lame, MD;  Location: [place];  Service: Endoscopy;;  . [place]    Family History        Family Status  Relation Name Status  . Mother  (Not Specified)  . PGF  (Not Specified)  . Father  (Not Specified)  . Neg Hx  (Not Specified)        His family history includes Colon cancer in his paternal grandfather; Diabetes Mellitus II in his mother; Heart attack in his father. There is no history of Kidney disease, Prostate cancer, Bladder Cancer, or Kidney cancer.      Allergies  Allergen Reactions  . Amoxicillin Rash  . Penicillins Rash and Other (See Comments)    Has patient had a PCN reaction causing immediate rash, facial/tongue/throat swelling, SOB or lightheadedness with hypotension: No Has patient had a PCN reaction causing severe rash involving mucus membranes or skin necrosis: No Has patient had a PCN reaction that required hospitalization: No Has patient had a PCN reaction occurring within the last 10 years: No If all of the above answers are "NO", then may proceed with Cephalosporin use.      Current Outpatient Medications:  .  clomiPHENE (CLOMID) 50 MG tablet, TAKE 1/2 TABLET BY MOUTH DAILY, Disp: 45 tablet, Rfl: 3 .  lisinopril (ZESTRIL) 10 MG tablet, TAKE 1 TABLET BY MOUTH EVERY DAY, Disp: 90 tablet, Rfl: 3 .  meloxicam (MOBIC) 15 MG tablet, TAKE 1 TABLET BY MOUTH EVERY DAY AS NEEDED FOR PAIN, Disp: 90 tablet, Rfl: 0 .  tadalafil (CIALIS) 20 MG tablet, take 1 tablet by mouth if  needed (Patient taking differently: Take 20 mg by mouth daily as needed for erectile dysfunction. ), Disp: 6 tablet, Rfl: 12 .  zolpidem (AMBIEN) 10 MG tablet, TAKE 1 TABLET BY MOUTH EVERYDAY AT BEDTIME, Disp: 30 tablet, Rfl: 5   Patient Care Team: Jerrol Banana., MD as PCP - General (Family Medicine)    Objective:    Vitals: BP (!) 148/81   Pulse 84   Temp 97.9 F (36.6 C) (Temporal)   Resp 16   Ht _0  (1.778 m)   Wt 194 lb 3.2 oz (88.1 kg)   BMI 27.86 kg/m    Vitals:   12/08/18 1506  BP: (!) 148/81  Pulse: 84  Resp: 16  Temp: 97.9 F (36.6 C)  TempSrc: Temporal  Weight: 194 lb 3.2 oz (88.1 kg)  Height: _0  (1.778 m)     Physical Exam Vitals signs reviewed.  Constitutional:      Appearance: He is well-developed.  HENT:     Head: Normocephalic and atraumatic.     Right Ear: External ear normal.     Left Ear: External ear normal.  Eyes:     General: No scleral icterus.    Conjunctiva/sclera: Conjunctivae normal.  Neck:     Thyroid: No thyromegaly.  Cardiovascular:     Rate and Rhythm: Normal rate and regular rhythm.     Heart sounds: Normal heart sounds.  Pulmonary:     Effort: Pulmonary effort is normal.     Breath sounds: Normal breath sounds.  Abdominal:     Palpations: Abdomen is soft.  Skin:    General: Skin is warm and dry.  Neurological:     General: No focal deficit present.     Mental Status: He is alert and oriented to person, place, and time.  Psychiatric:        Mood and Affect: Mood normal.        Behavior: Behavior normal.        Thought Content: Thought content normal.        Judgment: Judgment normal.      Depression Screen PHQ 2/9 Scores 12/08/2018 11/17/2016 10/03/2015 09/20/2014  PHQ - 2 Score 0 0 0 0  PHQ- 9 Score 2 2 - -       Assessment & Plan:     Routine Health Maintenance and Physical Exam  Exercise Activities and Dietary recommendations Goals   None     Immunization History  Administered Date(s)  Administered  . Influenza Inj Mdck Quad Pf 10/22/2016  . Influenza Inj Mdck Quad With Preservative 10/10/2017  . Influenza,inj,Quad PF,6+ Mos 10/03/2015, 10/03/2018  . Influenza-Unspecified 10/22/2016, 10/04/2018  . Tdap 08/11/2012    Health Maintenance  Topic Date Due  . HIV Screening  07/27/1974  . COLONOSCOPY  12/01/2020  . TETANUS/TDAP  08/12/2022  . INFLUENZA VACCINE  Completed  . Hepatitis C Screening  Completed     Discussed health benefits of physical activity, and encouraged him to engage in regular exercise appropriate for his age and condition.    --------------------------------------------------------------------  1. Annual physical exam   2. Insomnia, unspecified type Discussed cutting back on use of zolpidem. - CBC with Diff - Comp Met (CMET) - TSH - Lipid panel - zolpidem (AMBIEN) 10 MG tablet; Take 1 tablet (10 mg total) by mouth once for 1 dose.  Dispense: 30 tablet; Refill: 5  3. Essential hypertension .  Fair control.  Habits and recheck in 6 months. - CBC with Diff - Comp Met (CMET) - TSH - Lipid panel - lisinopril (ZESTRIL) 20 MG tablet; Take 1 tablet (20 mg total) by mouth daily.  Dispense: 90 tablet; Refill: 3  4. SVT (supraventricular tachycardia) ([facility])  - CBC with Diff - Comp Met (CMET) - TSH - Lipid panel  5. Hypercholesteremia  - CBC with Diff - Comp Met (CMET) - TSH - Lipid panel  6. ED (erectile dysfunction) of organic origin   7. Cervical radiculopathy Well post surgery  8. Hypogonadism in male Urology.   Richard Cranford Mon, MD  [facility]

## 2018-12-08 ENCOUNTER — Other Ambulatory Visit: Payer: Self-pay

## 2018-12-08 ENCOUNTER — Ambulatory Visit (INDEPENDENT_AMBULATORY_CARE_PROVIDER_SITE_OTHER): Payer: BC Managed Care – PPO | Admitting: Family Medicine

## 2018-12-08 ENCOUNTER — Encounter: Payer: Self-pay | Admitting: Family Medicine

## 2018-12-08 ENCOUNTER — Other Ambulatory Visit: Payer: Self-pay | Admitting: Family Medicine

## 2018-12-08 VITALS — BP 148/81 | HR 84 | Temp 97.9°F | Resp 16 | Ht 70.0 in | Wt 194.2 lb

## 2018-12-08 DIAGNOSIS — M5412 Radiculopathy, cervical region: Secondary | ICD-10-CM

## 2018-12-08 DIAGNOSIS — E291 Testicular hypofunction: Secondary | ICD-10-CM

## 2018-12-08 DIAGNOSIS — Z0000 Encounter for general adult medical examination without abnormal findings: Secondary | ICD-10-CM

## 2018-12-08 DIAGNOSIS — G4700 Insomnia, unspecified: Secondary | ICD-10-CM | POA: Diagnosis not present

## 2018-12-08 DIAGNOSIS — I10 Essential (primary) hypertension: Secondary | ICD-10-CM | POA: Diagnosis not present

## 2018-12-08 DIAGNOSIS — E7800 Pure hypercholesterolemia, unspecified: Secondary | ICD-10-CM | POA: Diagnosis not present

## 2018-12-08 DIAGNOSIS — N529 Male erectile dysfunction, unspecified: Secondary | ICD-10-CM

## 2018-12-08 DIAGNOSIS — I471 Supraventricular tachycardia: Secondary | ICD-10-CM | POA: Diagnosis not present

## 2018-12-08 MED ORDER — LISINOPRIL 20 MG PO TABS: 20.0000 mg | ORAL_TABLET | Freq: Every day | ORAL | 3 refills | Status: DC

## 2018-12-08 MED ORDER — ZOLPIDEM TARTRATE 10 MG PO TABS: 10.0000 mg | ORAL_TABLET | Freq: Once | ORAL | 5 refills | Status: DC

## 2018-12-08 NOTE — Patient Instructions (Signed)
We are increasing your lisinopril dose to 20mg  a day.

## 2018-12-09 ENCOUNTER — Other Ambulatory Visit: Payer: Self-pay

## 2018-12-09 DIAGNOSIS — N529 Male erectile dysfunction, unspecified: Secondary | ICD-10-CM

## 2018-12-09 LAB — COMPREHENSIVE METABOLIC PANEL
ALT: 53 IU/L — ABNORMAL HIGH (ref 0–44)
AST: 35 IU/L (ref 0–40)
Albumin/Globulin Ratio: 2.7 — ABNORMAL HIGH (ref 1.2–2.2)
Albumin: 4.8 g/dL (ref 3.8–4.9)
Alkaline Phosphatase: 124 IU/L — ABNORMAL HIGH (ref 39–117)
BUN/Creatinine Ratio: 19 (ref 9–20)
BUN: 16 mg/dL (ref 6–24)
Bilirubin Total: 1 mg/dL (ref 0.0–1.2)
CO2: 24 mmol/L (ref 20–29)
Calcium: 9.4 mg/dL (ref 8.7–10.2)
Chloride: 100 mmol/L (ref 96–106)
Creatinine, Ser: 0.86 mg/dL (ref 0.76–1.27)
GFR calc Af Amer: 110 mL/min/{1.73_m2} (ref >59)
GFR calc non Af Amer: 95 mL/min/{1.73_m2} (ref >59)
Globulin, Total: 1.8 g/dL (ref 1.5–4.5)
Glucose: 87 mg/dL (ref 65–99)
Potassium: 3.7 mmol/L (ref 3.5–5.2)
Sodium: 138 mmol/L (ref 134–144)
Total Protein: 6.6 g/dL (ref 6.0–8.5)

## 2018-12-09 LAB — CBC WITH DIFFERENTIAL/PLATELET
Basophils Absolute: 0 10*3/uL (ref 0.0–0.2)
Basos: 0 %
EOS (ABSOLUTE): 0.1 10*3/uL (ref 0.0–0.4)
Eos: 1 %
Hematocrit: 44.1 % (ref 37.5–51.0)
Hemoglobin: 15 g/dL (ref 13.0–17.7)
Immature Grans (Abs): 0 10*3/uL (ref 0.0–0.1)
Immature Granulocytes: 0 %
Lymphocytes Absolute: 1.9 10*3/uL (ref 0.7–3.1)
Lymphs: 18 %
MCH: 31.1 pg (ref 26.6–33.0)
MCHC: 34 g/dL (ref 31.5–35.7)
MCV: 92 fL (ref 79–97)
Monocytes Absolute: 0.6 10*3/uL (ref 0.1–0.9)
Monocytes: 5 %
Neutrophils Absolute: 7.9 10*3/uL — ABNORMAL HIGH (ref 1.4–7.0)
Neutrophils: 76 %
Platelets: 197 10*3/uL (ref 150–450)
RBC: 4.82 x10E6/uL (ref 4.14–5.80)
RDW: 13 % (ref 11.6–15.4)
WBC: 10.5 10*3/uL (ref 3.4–10.8)

## 2018-12-09 LAB — LIPID PANEL
Chol/HDL Ratio: 4.2 ratio (ref 0.0–5.0)
Cholesterol, Total: 213 mg/dL — ABNORMAL HIGH (ref 100–199)
HDL: 51 mg/dL (ref >39)
LDL Chol Calc (NIH): 124 mg/dL — ABNORMAL HIGH (ref 0–99)
Triglycerides: 215 mg/dL — ABNORMAL HIGH (ref 0–149)
VLDL Cholesterol Cal: 38 mg/dL (ref 5–40)

## 2018-12-09 LAB — TSH: TSH: 1.08 u[IU]/mL (ref 0.450–4.500)

## 2018-12-09 MED ORDER — TADALAFIL 20 MG PO TABS: 20.0000 mg | ORAL_TABLET | ORAL | 3 refills | Status: AC | PRN

## 2018-12-09 NOTE — Telephone Encounter (Signed)
-----   Message from Jerrol Banana., MD sent at 12/09/2018  7:19 AM EST ----- Labs in normal range.

## 2018-12-10 MED ORDER — ZOLPIDEM TARTRATE 10 MG PO TABS: 10.0000 mg | ORAL_TABLET | Freq: Once | ORAL | 5 refills | Status: DC

## 2018-12-23 ENCOUNTER — Telehealth: Payer: Self-pay | Admitting: Urology

## 2019-01-01 NOTE — Telephone Encounter (Signed)
Liver enzymes are elevated.  I cannot refill Clomid.  It is contraindicated in persons with elevated LFT's.

## 2019-01-02 NOTE — Telephone Encounter (Signed)
LMOM for patient to return call.

## 2019-01-02 NOTE — Telephone Encounter (Signed)
Patient called back I read your message to him about his liver function he stated he has a 90 day supply and didn't need a refill for this medication.    Sharyn Lull

## 2019-02-21 ENCOUNTER — Other Ambulatory Visit: Payer: Self-pay

## 2019-02-21 ENCOUNTER — Other Ambulatory Visit: Payer: BLUE CROSS/BLUE SHIELD

## 2019-02-21 DIAGNOSIS — N138 Other obstructive and reflux uropathy: Secondary | ICD-10-CM

## 2019-02-21 DIAGNOSIS — E349 Endocrine disorder, unspecified: Secondary | ICD-10-CM

## 2019-02-22 ENCOUNTER — Other Ambulatory Visit: Payer: Self-pay

## 2019-02-22 ENCOUNTER — Other Ambulatory Visit: Payer: BC Managed Care – PPO

## 2019-02-22 DIAGNOSIS — E349 Endocrine disorder, unspecified: Secondary | ICD-10-CM | POA: Diagnosis not present

## 2019-02-22 DIAGNOSIS — N138 Other obstructive and reflux uropathy: Secondary | ICD-10-CM

## 2019-02-22 DIAGNOSIS — N401 Enlarged prostate with lower urinary tract symptoms: Secondary | ICD-10-CM | POA: Diagnosis not present

## 2019-02-23 ENCOUNTER — Ambulatory Visit: Payer: BLUE CROSS/BLUE SHIELD | Admitting: Urology

## 2019-02-23 LAB — HEPATIC FUNCTION PANEL
ALT: 41 IU/L (ref 0–44)
AST: 37 IU/L (ref 0–40)
Albumin: 4.6 g/dL (ref 3.8–4.9)
Alkaline Phosphatase: 116 IU/L (ref 39–117)
Bilirubin Total: 0.8 mg/dL (ref 0.0–1.2)
Bilirubin, Direct: 0.41 mg/dL — ABNORMAL HIGH (ref 0.00–0.40)
Total Protein: 6.7 g/dL (ref 6.0–8.5)

## 2019-02-23 LAB — HEMATOCRIT: Hematocrit: 40.2 % (ref 37.5–51.0)

## 2019-02-23 LAB — PSA: Prostate Specific Ag, Serum: 1.8 ng/mL (ref 0.0–4.0)

## 2019-02-23 LAB — TESTOSTERONE: Testosterone: 610 ng/dL (ref 264–916)

## 2019-02-23 LAB — HEMOGLOBIN: Hemoglobin: 14.2 g/dL (ref 13.0–17.7)

## 2019-03-02 ENCOUNTER — Encounter: Payer: Self-pay | Admitting: Urology

## 2019-03-02 ENCOUNTER — Other Ambulatory Visit: Payer: Self-pay | Admitting: Family Medicine

## 2019-03-02 ENCOUNTER — Ambulatory Visit: Payer: BC Managed Care – PPO | Admitting: Urology

## 2019-03-02 ENCOUNTER — Other Ambulatory Visit: Payer: Self-pay

## 2019-03-02 VITALS — BP 125/78 | HR 81 | Ht 70.0 in | Wt 190.0 lb

## 2019-03-02 DIAGNOSIS — R17 Unspecified jaundice: Secondary | ICD-10-CM

## 2019-03-02 DIAGNOSIS — E349 Endocrine disorder, unspecified: Secondary | ICD-10-CM

## 2019-03-02 DIAGNOSIS — N529 Male erectile dysfunction, unspecified: Secondary | ICD-10-CM

## 2019-03-02 DIAGNOSIS — N401 Enlarged prostate with lower urinary tract symptoms: Secondary | ICD-10-CM | POA: Diagnosis not present

## 2019-03-02 DIAGNOSIS — N138 Other obstructive and reflux uropathy: Secondary | ICD-10-CM

## 2019-03-02 MED ORDER — CLOMIPHENE CITRATE 50 MG PO TABS: ORAL_TABLET | ORAL | 3 refills | Status: DC

## 2019-03-02 NOTE — Progress Notes (Signed)
02/22/2018 3:31 PM   [place] Jun 20, 1960 [id number]  Referring provider: Jerrol Banana., MD [address] [place],   [address]  Chief Complaint  Patient presents with  . Hypogonadism    HPI: Christopher Rose is a 59 y.o. male with testosterone deficiency, erectile dysfunction and BPH with LUTS who presents today for a 6 month follow up.   Testosterone deficiency He is currently managing his testosterone deficiency with Clomid 50 mg, 1/2 daily.  His most recent testosterone level was 610 ng/dL on 02/22/2019.  Hbg, HCT and LFT's are normal, for the exception of a very slight elevation of direct bilirubin.   He is still having spontaneous erections on an occasional basis.  Erectile dysfunction His SHIM score is 23 which is no ED.   His previous SHIM score was 22.  His risk factors for ED are age, BPH, testosterone deficiency and HTN.  He denies any painful erections or curvatures with his erections.  He has tried Cialis 20 mg, 1/2 tablet in the past with adequate results.  SHIM    Row Name 03/02/19 1505         SHIM: Over the last 6 months:   How do you rate your confidence that you could get and keep an erection?  Moderate     When you had erections with sexual stimulation, how often were your erections hard enough for penetration (entering your partner)?  Almost Always or Always     During sexual intercourse, how often were you able to maintain your erection after you had penetrated (entered) your partner?  Almost Always or Always     During sexual intercourse, how difficult was it to maintain your erection to completion of intercourse?  Not Difficult     When you attempted sexual intercourse, how often was it satisfactory for you?  Almost Always or Always       SHIM Total Score   SHIM  23       Score: 1-7 Severe ED 8-11 Moderate ED 12-16 Mild-Moderate ED 17-21 Mild ED 22-25 No ED  BPH WITH LUTS  (prostate and/or bladder) IPSS score: 1/0     Previous score: 4/0  Denies any dysuria, hematuria or suprapubic pain.  Denies any recent fevers, chills, nausea or vomiting.  He does not have a family history of PCa.  IPSS    Row Name 03/02/19 1500         International Prostate Symptom Score   How often have you had the sensation of not emptying your bladder?  Not at All     How often have you had to urinate less than every two hours?  Not at All     How often have you found you stopped and started again several times when you urinated?  Not at All     How often have you found it difficult to postpone urination?  Not at All     How often have you had a weak urinary stream?  Not at All     How often have you had to strain to start urination?  Not at All     How many times did you typically get up at night to urinate?  1 Time     Total IPSS Score  1       Quality of Life due to urinary symptoms   If you were to spend the rest of your life with your urinary condition just  the way it is now how would you feel about that?  Delighted       Score:  1-7 Mild 8-19 Moderate 20-35 Severe  PMH: Past Medical History:  Diagnosis Date  . Anal fissure   . Arthritis    knees  . BPH (benign prostatic hyperplasia)   . Chronic insomnia   . Chronic sinusitis   . ED (erectile dysfunction)   . HLD (hyperlipidemia)   . HTN (hypertension)   . Hypogonadism in male   . Measles   . Mumps   . Palpitations   . SVT (supraventricular tachycardia) [facility])     Surgical History: Past Surgical History:  Procedure Laterality Date  . ANTERIOR CERVICAL DECOMP/DISCECTOMY FUSION N/A 12/06/2017   Procedure: ANTERIOR CERVICAL DECOMPRESSION/DISCECTOMY FUSION 1 LEVEL-C6-7;  Surgeon: Kathlene November, MD;  Location: [facility];  Service: Neurosurgery;  Laterality: N/A;  . cardiac ablation surgery  2010  . COLONOSCOPY WITH PROPOFOL N/A 12/02/2015   Procedure: COLONOSCOPY WITH PROPOFOL;  Surgeon: Lucilla Lame, MD;  Location: [place];  Service:  Endoscopy;  Laterality: N/A;  . HERNIA REPAIR  1990  . POLYPECTOMY  12/02/2015   Procedure: POLYPECTOMY;  Surgeon: Lucilla Lame, MD;  Location: [place];  Service: Endoscopy;;  . VASECTOMY  1992    Home Medications:  Allergies as of 03/02/2019      Reactions   Amoxicillin Rash   Penicillins Rash, Other (See Comments)   Has patient had a PCN reaction causing immediate rash, facial/tongue/throat swelling, SOB or lightheadedness with hypotension: No Has patient had a PCN reaction causing severe rash involving mucus membranes or skin necrosis: No Has patient had a PCN reaction that required hospitalization: No Has patient had a PCN reaction occurring within the last 10 years: No If all of the above answers are "NO", then may proceed with Cephalosporin use.      Medication List       Accurate as of March 02, 2019  3:31 PM. If you have any questions, ask your nurse or doctor.        clomiPHENE 50 MG tablet Commonly known as: CLOMID TAKE 1/2 TABLET BY MOUTH DAILY   lisinopril 10 MG tablet Commonly known as: ZESTRIL Take 10 mg by mouth daily. What changed: Another medication with the same name was removed. Continue taking this medication, and follow the directions you see here. Changed by: Zara Council, PA-C   meloxicam 15 MG tablet Commonly known as: MOBIC TAKE 1 TABLET BY MOUTH EVERY DAY AS NEEDED FOR PAIN   tadalafil 20 MG tablet Commonly known as: Cialis Take 1 tablet (20 mg total) by mouth every three (3) days as needed for erectile dysfunction. Every 3 DAYS prn   zolpidem 10 MG tablet Commonly known as: AMBIEN Take 1 tablet (10 mg total) by mouth once for 1 dose.       Allergies:  Allergies  Allergen Reactions  . Amoxicillin Rash  . Penicillins Rash and Other (See Comments)    Has patient had a PCN reaction causing immediate rash, facial/tongue/throat swelling, SOB or lightheadedness with hypotension: No Has patient had a PCN reaction causing  severe rash involving mucus membranes or skin necrosis: No Has patient had a PCN reaction that required hospitalization: No Has patient had a PCN reaction occurring within the last 10 years: No If all of the above answers are "NO", then may proceed with Cephalosporin use.     Family History: Family History  Problem Relation Age of Onset  .  Diabetes Mellitus II Mother   . Colon cancer Paternal Grandfather   . Heart attack Father   . Kidney disease Neg Hx   . Prostate cancer Neg Hx   . Bladder Cancer Neg Hx   . Kidney cancer Neg Hx     Social History:  reports that he has never smoked. He has never used smokeless tobacco. He reports current alcohol use. He reports that he does not use drugs.  ROS: Pertinent review of systems can be found in history of present illness  Physical Exam: BP 125/78   Pulse 81   Ht 5\' 10"  (1.778 m)   Wt 190 lb (86.2 kg)   BMI 27.26 kg/m   Constitutional:  Well nourished. Alert and oriented, No acute distress. HEENT: [place] AT, mask in place.  Trachea midline, no masses. Cardiovascular: No clubbing, cyanosis, or edema. Respiratory: Normal respiratory effort, no increased work of breathing. GI: Abdomen is soft, non tender, non distended, no abdominal masses. Liver and spleen not palpable.  No hernias appreciated.  Stool sample for occult testing is not indicated.   GU: No CVA tenderness.  No bladder fullness or masses.  Patient with circumcised phallus.  Urethral meatus is patent.  No penile discharge. No penile lesions or rashes. Scrotum without lesions, cysts, rashes and/or edema.  Testicles are located scrotally bilaterally. No masses are appreciated in the testicles. Left and right epididymis are normal. Rectal: Patient with  normal sphincter tone. Anus and perineum without scarring or rashes. No rectal masses are appreciated. Prostate is approximately 50 grams, no nodules are appreciated. Seminal vesicles could not be palpated Skin: No rashes, bruises or  suspicious lesions. Lymph: No inguinal adenopathy. Neurologic: Grossly intact, no focal deficits, moving all 4 extremities. Psychiatric: Normal mood and affect.  Laboratory Data: Results for orders placed or performed in visit on 02/22/19  Hepatic function panel  Result Value Ref Range   Total Protein 6.7 6.0 - 8.5 g/dL   Albumin 4.6 3.8 - 4.9 g/dL   Bilirubin Total 0.8 0.0 - 1.2 mg/dL   Bilirubin, Direct 0.41 (H) 0.00 - 0.40 mg/dL   Alkaline Phosphatase 116 39 - 117 IU/L   AST 37 0 - 40 IU/L   ALT 41 0 - 44 IU/L  Hemoglobin  Result Value Ref Range   Hemoglobin 14.2 13.0 - 17.7 g/dL  Hematocrit  Result Value Ref Range   Hematocrit 40.2 37.5 - 51.0 %  Testosterone  Result Value Ref Range   Testosterone 610 264 - 916 ng/dL  PSA  Result Value Ref Range   Prostate Specific Ag, Serum 1.8 0.0 - 4.0 ng/mL   Lab Results  Component Value Date   WBC 10.5 12/08/2018   HGB 14.2 02/22/2019   HCT 40.2 02/22/2019   MCV 92 12/08/2018   PLT 197 12/08/2018    Lab Results  Component Value Date   CREATININE 0.86 12/08/2018   Lipid Panel     Component Value Date/Time   CHOL 213 (H) 12/08/2018 1606   TRIG 215 (H) 12/08/2018 1606   HDL 51 12/08/2018 1606   CHOLHDL 4.2 12/08/2018 1606   LDLCALC 124 (H) 12/08/2018 1606    Lab Results  Component Value Date   AST 37 02/22/2019   Lab Results  Component Value Date   ALT 41 02/22/2019   I reviewed the labs  Assessment & Plan:    1. Testosterone deficiency: - Most recent testosterone level is 610 ng/dL on 02/22/2019 (therapeutic levels 450-600) - Continue Clomid  50 mg, 1/2 tablet daily - direct bilirubin a bit elevated - repeat hepatic panel in one month  2. BPH with LUTS - IPSS score is 1/0, it is improved - Continue conservative management, avoiding bladder irritants and timed voiding's - RTC in 1 year for IPSS, PSA and exam, as testosterone therapy can cause prostate enlargement and worsen LUTS  3. Erectile dysfunction:     - SHIM score is 23, it is improved - Continue Cialis - RTC in 1 year for SHIM score and exam, as testosterone therapy can affect erections  Return in about 1 month (around 03/30/2019) for repeat hepatic panel .  Zara Council, PA-C  [facility] [address] [place], [place] [address] [phone]

## 2019-03-12 ENCOUNTER — Ambulatory Visit: Payer: BC Managed Care – PPO | Attending: Internal Medicine

## 2019-03-12 DIAGNOSIS — Z23 Encounter for immunization: Secondary | ICD-10-CM

## 2019-03-12 NOTE — Progress Notes (Signed)
   Covid-19 Vaccination Clinic  Name:  Christopher Rose    MRN: [id number] DOB: 12/31/1960  03/12/2019  [name] was observed post Covid-19 immunization for 15 minutes without incident. He was provided with Vaccine Information Sheet and instruction to access the V-Safe system.   [name] was instructed to call 911 with any severe reactions post vaccine: Marland Kitchen Difficulty breathing  . Swelling of face and throat  . A fast heartbeat  . A bad rash all over body  . Dizziness and weakness   Immunizations Administered    Name Date Dose VIS Date Route   Pfizer COVID-19 Vaccine 03/12/2019 11:10 AM 0.3 mL 12/16/2018 Intramuscular   Manufacturer: [place]   Lot: [id number]   [place]: [id number]

## 2019-03-30 ENCOUNTER — Other Ambulatory Visit: Payer: Self-pay

## 2019-03-30 ENCOUNTER — Other Ambulatory Visit: Payer: BC Managed Care – PPO

## 2019-03-30 DIAGNOSIS — E349 Endocrine disorder, unspecified: Secondary | ICD-10-CM

## 2019-03-31 ENCOUNTER — Telehealth: Payer: Self-pay

## 2019-03-31 DIAGNOSIS — R17 Unspecified jaundice: Secondary | ICD-10-CM

## 2019-03-31 LAB — HEPATIC FUNCTION PANEL
ALT: 57 IU/L — ABNORMAL HIGH (ref 0–44)
AST: 37 IU/L (ref 0–40)
Albumin: 4.7 g/dL (ref 3.8–4.9)
Alkaline Phosphatase: 130 IU/L — ABNORMAL HIGH (ref 39–117)
Bilirubin Total: 0.7 mg/dL (ref 0.0–1.2)
Bilirubin, Direct: 0.33 mg/dL (ref 0.00–0.40)
Total Protein: 6.9 g/dL (ref 6.0–8.5)

## 2019-03-31 NOTE — Telephone Encounter (Signed)
-----   Message from Nori Riis, PA-C sent at 03/31/2019  7:56 AM EDT ----- Please let Mr. Harker know that his liver enzymes are elevated again.  We should repeat the blood work while he is fasting and see if that brings his values back to normal.

## 2019-03-31 NOTE — Telephone Encounter (Signed)
Called pt informed him of the information below. PT gave verbal understanding. Lab ordered. Pt scheduled

## 2019-04-04 ENCOUNTER — Ambulatory Visit: Payer: BC Managed Care – PPO | Attending: Internal Medicine

## 2019-04-04 DIAGNOSIS — Z23 Encounter for immunization: Secondary | ICD-10-CM

## 2019-04-04 NOTE — Progress Notes (Signed)
   Covid-19 Vaccination Clinic  Name:  LONZY BABIARZ    MRN: [id number] DOB: 03-07-1960  04/04/2019  Mr. Hyndman was observed post Covid-19 immunization for 15 minutes without incident. He was provided with Vaccine Information Sheet and instruction to access the V-Safe system.   Mr. Kreger was instructed to call 911 with any severe reactions post vaccine: Marland Kitchen Difficulty breathing  . Swelling of face and throat  . A fast heartbeat  . A bad rash all over body  . Dizziness and weakness   Immunizations Administered    Name Date Dose VIS Date Route   Pfizer COVID-19 Vaccine 04/04/2019  8:41 AM 0.3 mL 12/16/2018 Intramuscular   Manufacturer: [place]   Lot: [id number]   [place]: [id number]

## 2019-04-05 ENCOUNTER — Other Ambulatory Visit: Payer: Self-pay

## 2019-04-05 ENCOUNTER — Other Ambulatory Visit: Payer: BC Managed Care – PPO

## 2019-04-05 DIAGNOSIS — R17 Unspecified jaundice: Secondary | ICD-10-CM | POA: Diagnosis not present

## 2019-04-06 LAB — HEPATIC FUNCTION PANEL
ALT: 36 IU/L (ref 0–44)
AST: 28 IU/L (ref 0–40)
Albumin: 4.8 g/dL (ref 3.8–4.9)
Alkaline Phosphatase: 120 IU/L — ABNORMAL HIGH (ref 39–117)
Bilirubin Total: 1.1 mg/dL (ref 0.0–1.2)
Bilirubin, Direct: 0.43 mg/dL — ABNORMAL HIGH (ref 0.00–0.40)
Total Protein: 6.8 g/dL (ref 6.0–8.5)

## 2019-04-25 NOTE — Progress Notes (Signed)
Established patient visit    Patient: Christopher Rose   DOB: 1960-09-20   59 y.o. Male  MRN: [id number] Visit Date: 05/01/2019  Today's healthcare provider: Wilhemena Durie, MD   Chief Complaint  Patient presents with  . Follow-up   Subjective    HPI Patient is here to discuss lab results from 04/04/2019 ordered by Urology.  He had mild elevations of liver function test so meds were stopped.  He stopped Clomid, Mobic, Tylenol.  He definitely would like to go back on the Clomid if possible as he has more fatigue and decreased libido even though his only been a month off of it.      Medications: Outpatient Medications Prior to Visit  Medication Sig  . lisinopril (ZESTRIL) 10 MG tablet Take 10 mg by mouth daily.  Marland Kitchen zolpidem (AMBIEN) 10 MG tablet Take 1 tablet (10 mg total) by mouth once for 1 dose.  . clomiPHENE (CLOMID) 50 MG tablet TAKE 1/2 TABLET BY MOUTH DAILY (Patient not taking: Reported on 05/01/2019)  . meloxicam (MOBIC) 15 MG tablet TAKE 1 TABLET BY MOUTH EVERY DAY AS NEEDED FOR PAIN (Patient not taking: Reported on 05/01/2019)  . tadalafil (CIALIS) 20 MG tablet Take 1 tablet (20 mg total) by mouth every three (3) days as needed for erectile dysfunction. Every 3 DAYS prn (Patient not taking: Reported on 05/01/2019)   No facility-administered medications prior to visit.    Review of Systems  Constitutional: Negative for appetite change, chills and fever.  HENT: Negative.   Respiratory: Negative for chest tightness, shortness of breath and wheezing.   Cardiovascular: Negative for chest pain and palpitations.  Gastrointestinal: Negative for abdominal pain, nausea and vomiting.  Endocrine: Negative.   Musculoskeletal: Positive for back pain.  Allergic/Immunologic: Negative.   Neurological: Negative.   Hematological: Negative.   Psychiatric/Behavioral: Negative.         Objective    BP 109/70 (BP Location: Left Arm, Patient Position: Sitting, Cuff Size: Large)    Pulse 98   Temp (!) 97.3 F (36.3 C) (Other (Comment))   Resp 16   Ht 5\' 10"  (1.778 m)   Wt 193 lb (87.5 kg)   SpO2 96%   BMI 27.69 kg/m     Physical Exam Vitals reviewed.  Constitutional:      Appearance: He is well-developed.  HENT:     Head: Normocephalic and atraumatic.     Right Ear: External ear normal.     Left Ear: External ear normal.  Eyes:     General: No scleral icterus.    Conjunctiva/sclera: Conjunctivae normal.  Neck:     Thyroid: No thyromegaly.  Cardiovascular:     Rate and Rhythm: Normal rate and regular rhythm.     Heart sounds: Normal heart sounds.  Pulmonary:     Effort: Pulmonary effort is normal.     Breath sounds: Normal breath sounds.  Abdominal:     Palpations: Abdomen is soft.     Comments: No hepatosplenomegaly  Skin:    General: Skin is warm and dry.  Neurological:     General: No focal deficit present.     Mental Status: He is alert and oriented to person, place, and time.  Psychiatric:        Mood and Affect: Mood normal.        Behavior: Behavior normal.        Thought Content: Thought content normal.        Judgment: Judgment  normal.       No results found for any visits on 05/01/19.   Assessment & Plan    1. Elevated liver function tests If LFTs are back to normal will restart Clomid at 1/2 tablet daily which she has been on and recheck liver functions in 2 to 3 months.  They off of the Mobic and Tylenol for now.  If they  remain elevated he will need further work - Hepatic function panel  2. Essential hypertension Avoid nonsteroidals if possible due to protection of kidneys  3. ED (erectile dysfunction) of organic origin   4. Hypogonadism in male Treated by urology.   Return in about 2 months (around 07/01/2019).         Richard Cranford Mon, MD  [facility] [phone] (phone) [phone] (fax)  [place]

## 2019-05-01 ENCOUNTER — Other Ambulatory Visit: Payer: Self-pay

## 2019-05-01 ENCOUNTER — Ambulatory Visit: Payer: BC Managed Care – PPO | Admitting: Family Medicine

## 2019-05-01 ENCOUNTER — Encounter: Payer: Self-pay | Admitting: Family Medicine

## 2019-05-01 VITALS — BP 109/70 | HR 98 | Temp 97.3°F | Resp 16 | Ht 70.0 in | Wt 193.0 lb

## 2019-05-01 DIAGNOSIS — I10 Essential (primary) hypertension: Secondary | ICD-10-CM

## 2019-05-01 DIAGNOSIS — E291 Testicular hypofunction: Secondary | ICD-10-CM

## 2019-05-01 DIAGNOSIS — N529 Male erectile dysfunction, unspecified: Secondary | ICD-10-CM

## 2019-05-01 DIAGNOSIS — R7989 Other specified abnormal findings of blood chemistry: Secondary | ICD-10-CM

## 2019-05-02 DIAGNOSIS — R7989 Other specified abnormal findings of blood chemistry: Secondary | ICD-10-CM | POA: Diagnosis not present

## 2019-05-03 LAB — HEPATIC FUNCTION PANEL
ALT: 33 IU/L (ref 0–44)
AST: 27 IU/L (ref 0–40)
Albumin: 4.4 g/dL (ref 3.8–4.9)
Alkaline Phosphatase: 142 IU/L — ABNORMAL HIGH (ref 39–117)
Bilirubin Total: 0.5 mg/dL (ref 0.0–1.2)
Bilirubin, Direct: 0.24 mg/dL (ref 0.00–0.40)
Total Protein: 6.2 g/dL (ref 6.0–8.5)

## 2019-05-19 IMAGING — MR MR CERVICAL SPINE W/O CM
5 series · 39 of 48 positions shown · non-contrast
Comparison: None.

CLINICAL DATA: Neck pain radiating to the left upper extremity.

EXAM:
MRI CERVICAL SPINE WITHOUT CONTRAST
TECHNIQUE: Multiplanar, multisequence MR imaging of the cervical spine was
performed. No intravenous contrast was administered.

[Series 5: T2 · sagittal · 3.0mm · 0.62mm/px · 6 of 15 slices shown (1 of 2)]
[im 1/15]
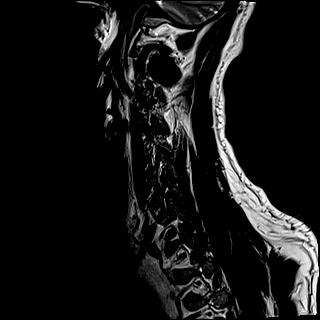
[im 3/15]
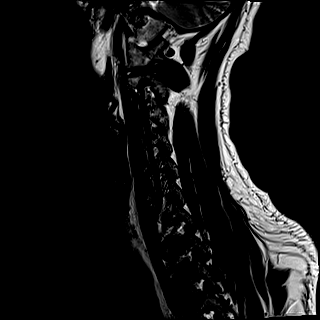
[im 6/15]
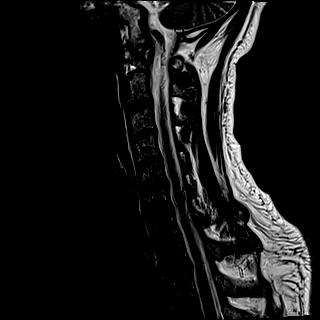
[im 9/15]
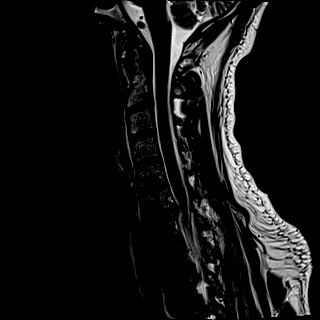
[im 12/15]
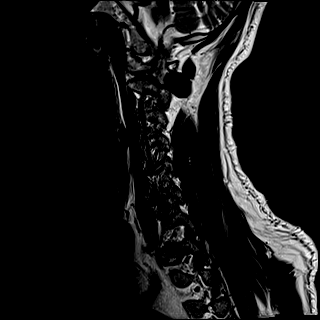
[im 15/15]
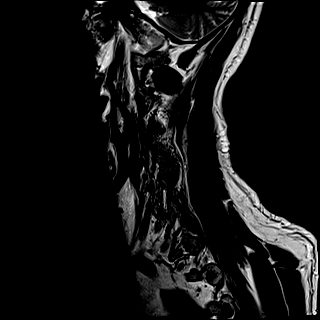

[Series 6: FLAIR · sagittal · 3.0mm · 0.78mm/px · 7 of 15 slices shown]
[im 1/15]
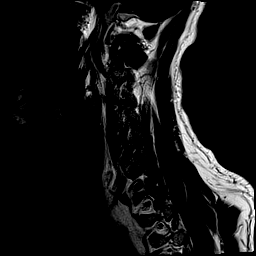
[im 3/15]
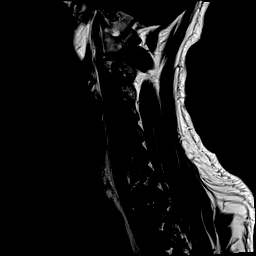
[im 5/15]
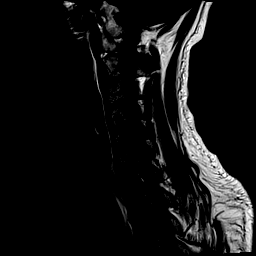
[im 8/15]
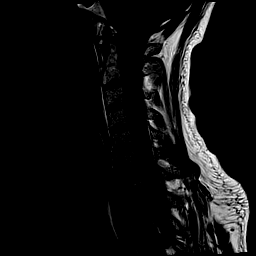
[im 10/15]
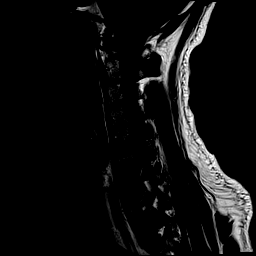
[im 12/15]
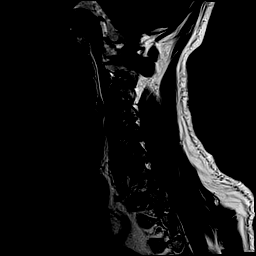
[im 15/15]
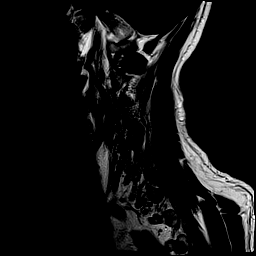

[Series 7: STIR · sagittal · 3.0mm · 0.62mm/px · 7 of 15 slices shown]
[im 1/15]
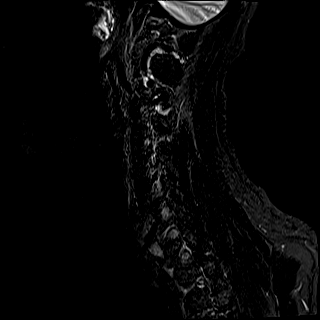
[im 3/15]
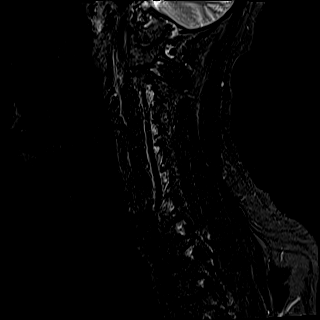
[im 5/15]
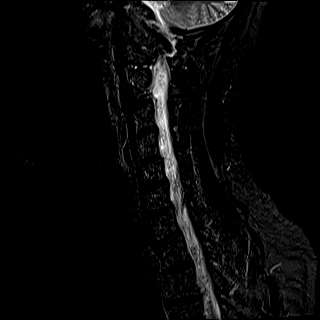
[im 8/15]
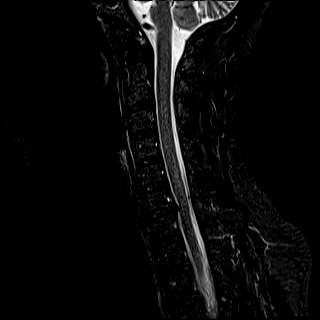
[im 10/15]
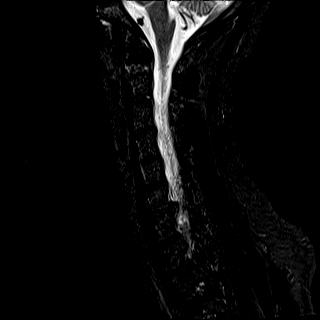
[im 12/15]
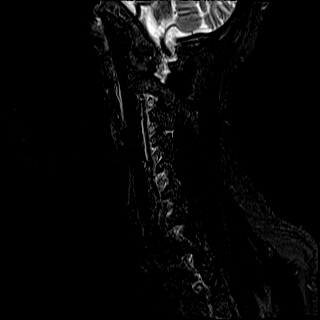
[im 15/15]
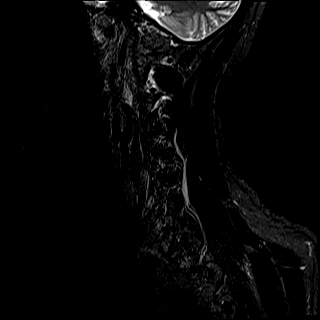

[Series 8: ax mpgr · axial · 3.0mm · 0.35mm/px · z∈[-75,+29]mm · 8 of 31 slices shown]
[im 1/31]
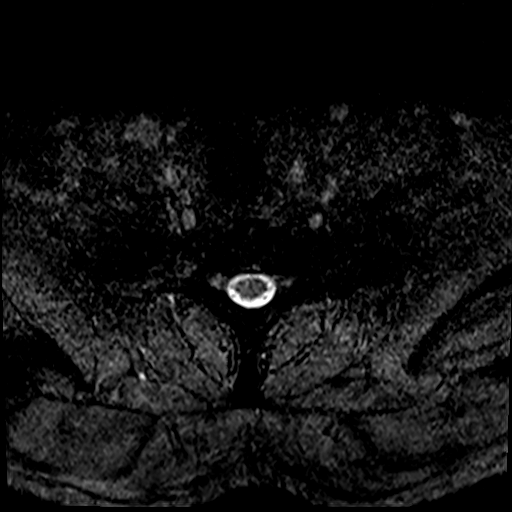
[im 5/31]
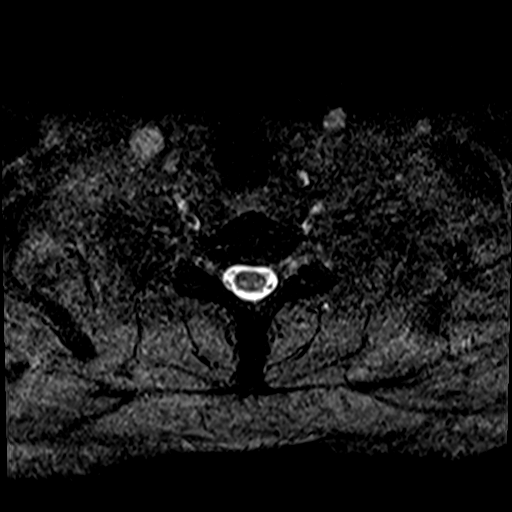
[im 10/31]
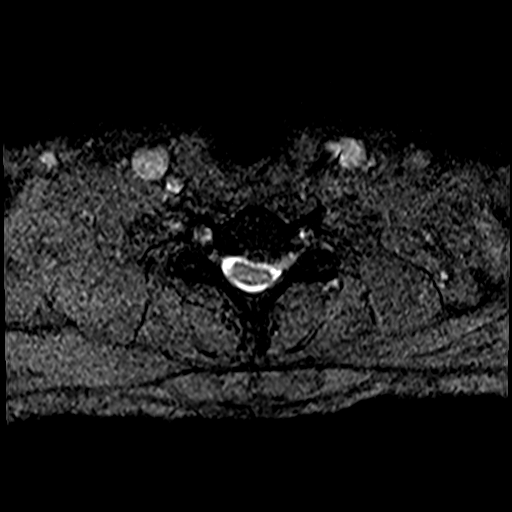
[im 14/31]
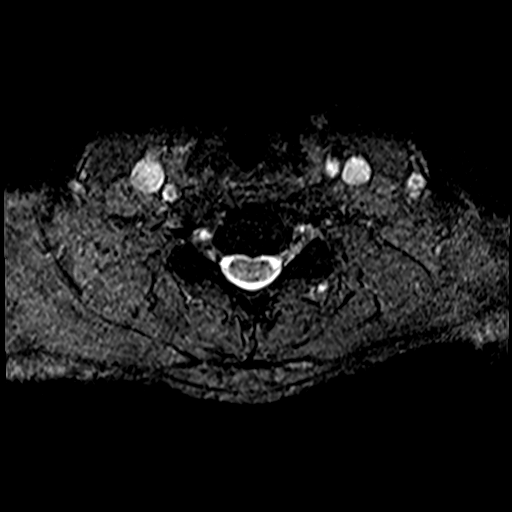
[im 17/31]
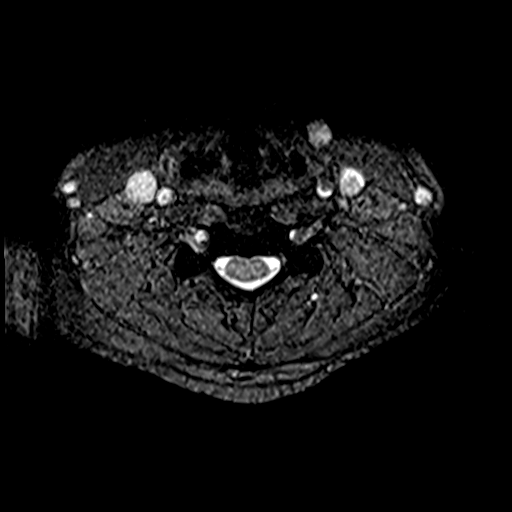
[im 21/31]
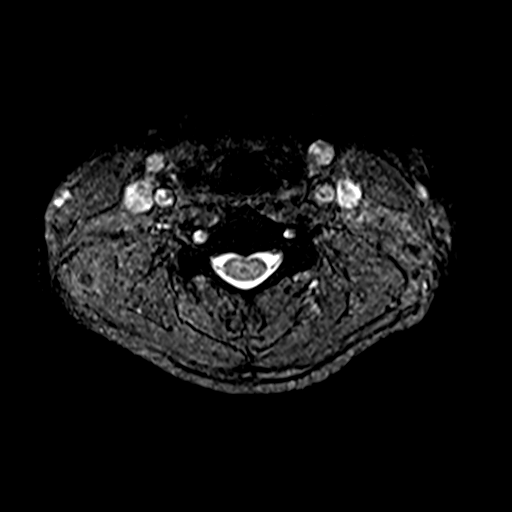
[im 26/31]
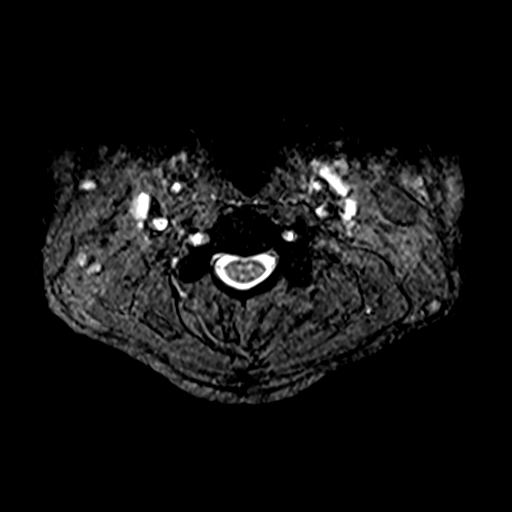
[im 31/31]
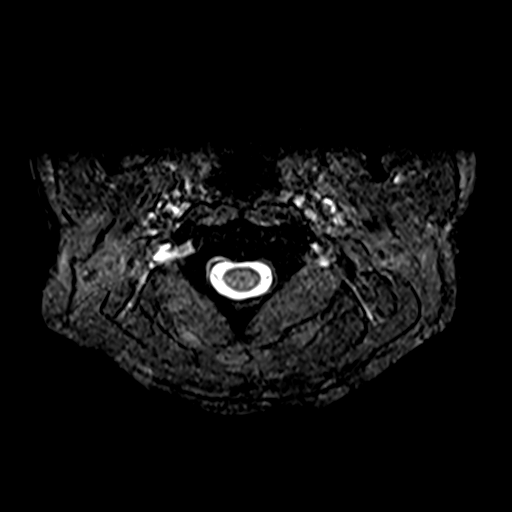

[Series 9: T2 · axial · 3.0mm · 0.70mm/px · z∈[-75,+29]mm · 11 of 31 slices shown (2 of 2)]
[im 1/31]
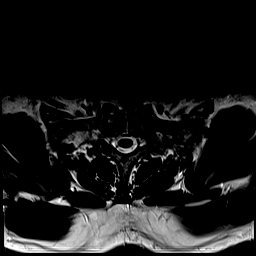
[im 3/31]
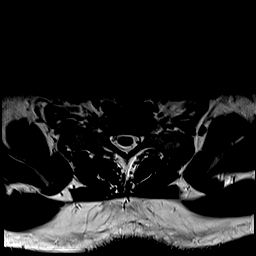
[im 5/31]
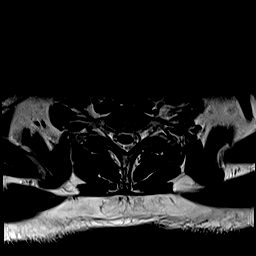
[im 7/31]
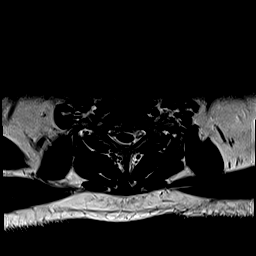
[im 10/31]
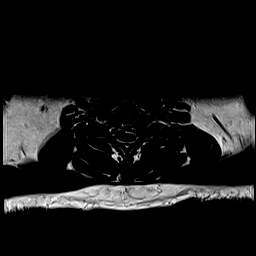
[im 12/31]
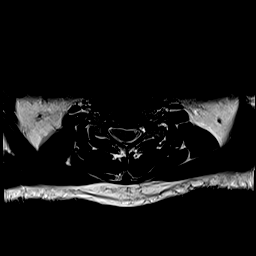
[im 14/31]
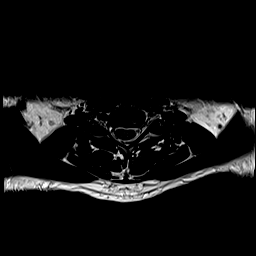
[im 17/31]
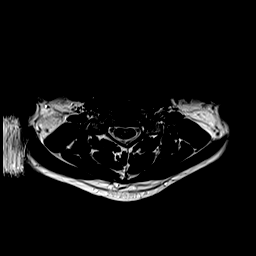
[im 21/31]
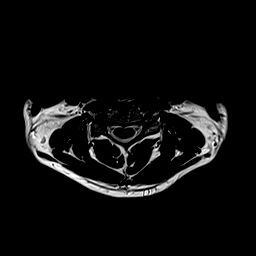
[im 26/31]
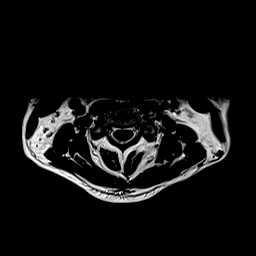
[im 31/31]
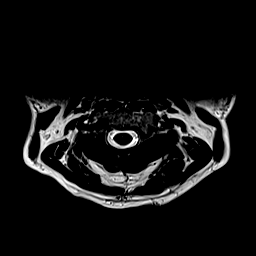

[39 of 48 positions shown; findings below may reference images not displayed]

FINDINGS: Alignment: Straightening of normal lordotic curvature. Normal
alignment.

Vertebrae: No acute compression fracture, discitis-osteomyelitis,
facet edema or other focal marrow lesion. No epidural collection.

Cord: Normal

Posterior Fossa, vertebral arteries, paraspinal tissues: Visualized
posterior fossa is normal. Vertebral artery flow voids are
preserved. No prevertebral soft tissue swelling.

Disc levels:

The C1-T3 levels are imaged in the sagittal plane, with axial images
obtained from C2-T1.

The C1-C2 articulation is normal.

The C2-C6 disc spaces are normal.

C6-C7: There is a large left subarticular/foraminal disc protrusion
that severely narrows the left neural foramen. This also deforms the
left ventral aspect of the spinal cord without causing signal
change. There is mild central spinal canal stenosis. No right
foraminal stenosis. Normal facets.

C7-T1: Normal.

Sagittal imaging of the T1-T3 levels is normal.
IMPRESSION: Large left subarticular/foraminal disc protrusion at C6-7, severely
narrowing the left neural foramen and impinging on the C7 nerve
root. Mild spinal canal stenosis with moderate indentation of the
left ventral aspect of the spinal cord without cord signal change.

## 2019-05-21 ENCOUNTER — Other Ambulatory Visit: Payer: Self-pay | Admitting: Family Medicine

## 2019-05-21 DIAGNOSIS — I10 Essential (primary) hypertension: Secondary | ICD-10-CM

## 2019-05-21 NOTE — Telephone Encounter (Signed)
Requested medication (s) are due for refill today: yes  Requested medication (s) are on the active medication list: --  Last refill:  02/10/19  Future visit scheduled: yes  Notes to clinic:  historical med and provider   Requested Prescriptions  Pending Prescriptions Disp Refills   lisinopril (ZESTRIL) 10 MG tablet [Pharmacy Med Name: LISINOPRIL 10 MG TABLET] 90 tablet 3    Sig: TAKE 1 TABLET BY MOUTH EVERY DAY      Cardiovascular:  ACE Inhibitors Passed - 05/21/2019  9:28 AM      Passed - Cr in normal range and within 180 days    Creatinine, Ser  Date Value Ref Range Status  12/08/2018 0.86 0.76 - 1.27 mg/dL Final          Passed - K in normal range and within 180 days    Potassium  Date Value Ref Range Status  12/08/2018 3.7 3.5 - 5.2 mmol/L Final          Passed - Patient is not pregnant      Passed - Last BP in normal range    BP Readings from Last 1 Encounters:  05/01/19 109/70          Passed - Valid encounter within last 6 months    Recent Outpatient Visits           2 weeks ago Elevated liver function tests   [facility] Jerrol Banana., MD   5 months ago Annual physical exam   [facility] Jerrol Banana., MD   1 year ago Essential hypertension   [facility] Jerrol Banana., MD   2 years ago Upper back strain, initial encounter   [facility] Jerrol Banana., MD   2 years ago Thoracic myofascial strain, initial encounter   [facility] Jerrol Banana., MD       Future Appointments             In 3 weeks Jerrol Banana., MD [facility] [place]   In 1 month Jerrol Banana., MD [facility] [place]

## 2019-06-08 ENCOUNTER — Ambulatory Visit: Payer: Self-pay | Admitting: Family Medicine

## 2019-06-14 ENCOUNTER — Ambulatory Visit: Payer: Self-pay | Admitting: Family Medicine

## 2019-06-23 ENCOUNTER — Other Ambulatory Visit: Payer: Self-pay | Admitting: Family Medicine

## 2019-06-23 DIAGNOSIS — G4700 Insomnia, unspecified: Secondary | ICD-10-CM

## 2019-06-24 ENCOUNTER — Other Ambulatory Visit: Payer: Self-pay | Admitting: Family Medicine

## 2019-06-24 DIAGNOSIS — G4700 Insomnia, unspecified: Secondary | ICD-10-CM

## 2019-06-27 MED ORDER — ZOLPIDEM TARTRATE 10 MG PO TABS: 10.0000 mg | ORAL_TABLET | Freq: Every day | ORAL | 1 refills | Status: DC

## 2019-06-29 ENCOUNTER — Other Ambulatory Visit: Payer: Self-pay | Admitting: Family Medicine

## 2019-06-29 DIAGNOSIS — G4700 Insomnia, unspecified: Secondary | ICD-10-CM

## 2019-06-29 NOTE — Telephone Encounter (Signed)
Requested  medications are  due for refill today yes  Requested medications are on the active medication list yes  Last refill 5/19  Future visit scheduled yes 6/30  Notes to clinic Not Delegated

## 2019-06-30 ENCOUNTER — Telehealth: Payer: Self-pay | Admitting: Family Medicine

## 2019-06-30 NOTE — Telephone Encounter (Signed)
PT needs a refill  zolpidem (AMBIEN) 10 MG tablet [[id number]]  [id number] [address], [place]  [place] [address]  Phone: [phone] Fax: [phone]

## 2019-06-30 NOTE — Telephone Encounter (Signed)
Patient requesting RF of non delegated Rx- already pended for PCP review

## 2019-07-02 ENCOUNTER — Encounter: Payer: Self-pay | Admitting: Family Medicine

## 2019-07-03 ENCOUNTER — Other Ambulatory Visit: Payer: Self-pay

## 2019-07-03 DIAGNOSIS — G4700 Insomnia, unspecified: Secondary | ICD-10-CM

## 2019-07-03 NOTE — Progress Notes (Signed)
I,Laura E Walsh,acting as a scribe for Wilhemena Durie, MD.,have documented all relevant documentation on the behalf of Wilhemena Durie, MD,as directed by  Wilhemena Durie, MD while in the presence of Wilhemena Durie, MD.   Established patient visit   Patient: Christopher Rose   DOB: Apr 17, 1960   59 y.o. Male  MRN: [id number] Visit Date: 07/05/2019  Today's healthcare provider: Wilhemena Durie, MD   Chief Complaint  Patient presents with  . Hypertension  . Insomnia  . Elevated Hepatic Enzymes  . Hypogonadism   Subjective    HPI  Overall patient feels well and has no complaints.  Trying to cut back on his sleeping pills Hypertension, follow-up  BP Readings from Last 3 Encounters:  07/05/19 113/77  05/01/19 109/70  03/02/19 125/78   Wt Readings from Last 3 Encounters:  07/05/19 191 lb (86.6 kg)  05/01/19 193 lb (87.5 kg)  03/02/19 190 lb (86.2 kg)     He was last seen for hypertension 2 months ago.  BP at that visit was 109/70. Management since that visit includes; Avoid nonsteroidals if possible due to protection of kidneys. He reports excellent compliance with treatment. He is not having side effects.  He is exercising. He is not adherent to low salt diet.   Outside blood pressures are being checked occasionally.  He does not smoke.  Use of agents associated with hypertension: none.   ---------------------------------------------------------------------------------------------------  Elevated liver function tests From 05/01/2019-If LFTs are back to normal will restart Clomid at 1/2 tablet daily which she has been on and recheck liver functions in 2 to 3 months.  They off of the Mobic and Tylenol for now.  If they  remain elevated he will need further work.   Follow up for Insomnia  The patient was last seen for this 2 months ago. Changes made at last visit includes only using Zolpidem as needed.  He reports excellent compliance with  treatment. He feels that condition is Improved.     -----------------------------------------------------------------------------------------    Social History   Tobacco Use  . Smoking status: Never Smoker  . Smokeless tobacco: Never Used  Vaping Use  . Vaping Use: Never used  Substance Use Topics  . Alcohol use: Yes    Alcohol/week: 0.0 standard drinks    Comment: occasional - holidays  . Drug use: No       Medications: Outpatient Medications Prior to Visit  Medication Sig  . clomiPHENE (CLOMID) 50 MG tablet TAKE 1/2 TABLET BY MOUTH DAILY  . lisinopril (ZESTRIL) 10 MG tablet TAKE 1 TABLET BY MOUTH EVERY DAY  . tadalafil (CIALIS) 20 MG tablet Take 1 tablet (20 mg total) by mouth every three (3) days as needed for erectile dysfunction. Every 3 DAYS prn  . zolpidem (AMBIEN) 10 MG tablet TAKE 1 TABLET BY MOUTH EVERY DAY  . meloxicam (MOBIC) 15 MG tablet TAKE 1 TABLET BY MOUTH EVERY DAY AS NEEDED FOR PAIN (Patient not taking: Reported on 05/01/2019)   No facility-administered medications prior to visit.    Review of Systems  Constitutional: Negative for appetite change, chills and fever.  Eyes: Negative.   Respiratory: Negative for chest tightness, shortness of breath and wheezing.   Cardiovascular: Negative for chest pain and palpitations.  Gastrointestinal: Negative for abdominal pain, nausea and vomiting.  Allergic/Immunologic: Negative.   Psychiatric/Behavioral: Negative.       Objective    BP 113/77 (BP Location: Left Arm, Patient Position: Sitting, Cuff  Size: Normal)   Pulse 75   Temp (!) 97.5 F (36.4 C) (Temporal)   Ht 5\' 10"  (1.778 m)   Wt 191 lb (86.6 kg)   BMI 27.41 kg/m    Physical Exam Constitutional:      Appearance: Normal appearance.  HENT:     Head: Normocephalic and atraumatic.  Eyes:     Conjunctiva/sclera: Conjunctivae normal.  Cardiovascular:     Rate and Rhythm: Normal rate and regular rhythm.  Pulmonary:     Effort: Pulmonary  effort is normal.     Breath sounds: Normal breath sounds.  Abdominal:     Palpations: Abdomen is soft.     Tenderness: There is no abdominal tenderness.  Musculoskeletal:     Right lower leg: No edema.     Left lower leg: No edema.  Lymphadenopathy:     Cervical: No cervical adenopathy.  Skin:    General: Skin is warm and dry.  Neurological:     Mental Status: He is alert and oriented to person, place, and time. Mental status is at baseline.  Psychiatric:        Mood and Affect: Mood normal.        Behavior: Behavior normal.        Thought Content: Thought content normal.        Judgment: Judgment normal.       No results found for any visits on 07/05/19.  Assessment & Plan       Problem List Items Addressed This Visit      Cardiovascular and Mediastinum   BP (high blood pressure) - Primary    Well controlled Continue current medications Recheck metabolic panel F/u in 6 months       Relevant Orders   Comprehensive metabolic panel     Endocrine   Hypogonadism in male    Pt reports doing well since restarting Clomid 50mg  1/2 daily, he also notes a decrease in fatigue.  Recheck liver functions today.  Will continue to be followed by Urology.          Other   Insomnia, persistent    Discussed the importance of decreasing use of zolpidem and only use as needed.  Pt reports he no longer needs to take it every night and only uses it as needed.        Insomnia   Elevated liver function tests    Has improved since last check. Will recheck today secondary to restarting Clomid.  Follow up in about 4 months.        Relevant Orders   Comprehensive metabolic panel       Return in about 4 months (around 11/04/2019) for for chronic illnesses including elevated liver functions.Wilhemena Durie, MD  [facility] [phone] (phone) [phone] (fax)  [place]

## 2019-07-03 NOTE — Telephone Encounter (Signed)
Please advise 

## 2019-07-05 ENCOUNTER — Encounter: Payer: Self-pay | Admitting: Family Medicine

## 2019-07-05 ENCOUNTER — Ambulatory Visit (INDEPENDENT_AMBULATORY_CARE_PROVIDER_SITE_OTHER): Payer: BC Managed Care – PPO | Admitting: Family Medicine

## 2019-07-05 ENCOUNTER — Other Ambulatory Visit: Payer: Self-pay

## 2019-07-05 VITALS — BP 113/77 | HR 75 | Temp 97.5°F | Ht 70.0 in | Wt 191.0 lb

## 2019-07-05 DIAGNOSIS — I10 Essential (primary) hypertension: Secondary | ICD-10-CM

## 2019-07-05 DIAGNOSIS — E291 Testicular hypofunction: Secondary | ICD-10-CM

## 2019-07-05 DIAGNOSIS — G4700 Insomnia, unspecified: Secondary | ICD-10-CM

## 2019-07-05 DIAGNOSIS — R7989 Other specified abnormal findings of blood chemistry: Secondary | ICD-10-CM | POA: Diagnosis not present

## 2019-07-05 NOTE — Assessment & Plan Note (Signed)
Well controlled Continue current medications Recheck metabolic panel F/u in 6 months  

## 2019-07-05 NOTE — Patient Instructions (Signed)

## 2019-07-05 NOTE — Assessment & Plan Note (Addendum)
Pt reports doing well since restarting Clomid 50mg  1/2 daily, he also notes a decrease in fatigue.  Recheck liver functions today.  Will continue to be followed by Urology.

## 2019-07-05 NOTE — Assessment & Plan Note (Signed)
Discussed the importance of decreasing use of zolpidem and only use as needed.  Pt reports he no longer needs to take it every night and only uses it as needed.

## 2019-07-05 NOTE — Assessment & Plan Note (Signed)
Has improved since last check. Will recheck today secondary to restarting Clomid.  Follow up in about 4 months.

## 2019-07-06 LAB — COMPREHENSIVE METABOLIC PANEL
ALT: 33 IU/L (ref 0–44)
AST: 25 IU/L (ref 0–40)
Albumin/Globulin Ratio: 2.3 — ABNORMAL HIGH (ref 1.2–2.2)
Albumin: 4.3 g/dL (ref 3.8–4.9)
Alkaline Phosphatase: 105 IU/L (ref 48–121)
BUN/Creatinine Ratio: 13 (ref 9–20)
BUN: 14 mg/dL (ref 6–24)
Bilirubin Total: 0.6 mg/dL (ref 0.0–1.2)
CO2: 23 mmol/L (ref 20–29)
Calcium: 9.1 mg/dL (ref 8.7–10.2)
Chloride: 104 mmol/L (ref 96–106)
Creatinine, Ser: 1.04 mg/dL (ref 0.76–1.27)
GFR calc Af Amer: 90 mL/min/{1.73_m2} (ref >59)
GFR calc non Af Amer: 78 mL/min/{1.73_m2} (ref >59)
Globulin, Total: 1.9 g/dL (ref 1.5–4.5)
Glucose: 100 mg/dL — ABNORMAL HIGH (ref 65–99)
Potassium: 4.5 mmol/L (ref 3.5–5.2)
Sodium: 139 mmol/L (ref 134–144)
Total Protein: 6.2 g/dL (ref 6.0–8.5)

## 2019-07-11 ENCOUNTER — Telehealth: Payer: Self-pay

## 2019-07-11 NOTE — Telephone Encounter (Signed)
Patient advised of lab results through mychart.  °

## 2019-07-11 NOTE — Telephone Encounter (Signed)
-----   Message from Jerrol Banana., MD sent at 07/08/2019  8:14 AM EDT ----- Labs stable.

## 2019-07-17 ENCOUNTER — Telehealth: Payer: Self-pay

## 2019-07-17 DIAGNOSIS — N138 Other obstructive and reflux uropathy: Secondary | ICD-10-CM

## 2019-07-17 DIAGNOSIS — E349 Endocrine disorder, unspecified: Secondary | ICD-10-CM

## 2019-07-17 NOTE — Telephone Encounter (Signed)
-----   Message from Nori Riis, PA-C sent at 07/16/2019  1:07 PM EDT ----- Christopher Rose LFT's has returned to normal and Dr. Rosanna Randy has restarted his Clomid.  I would like to see him in February for I PSS, SHIM and exam with labs prior (testosterone level before 10am, PSA, Hematocrit, Hemoglobin and Hepatic panel.

## 2019-07-17 NOTE — Telephone Encounter (Signed)
mychart notification sent and apts made Orders in

## 2019-07-18 LAB — CBC AND DIFFERENTIAL
HCT: 46 (ref 41–53)
Hemoglobin: 14.7 (ref 13.5–17.5)
LDH: 141
MCH: 31
MCHC: 31.7
MCV: 97.9 (ref 76–111)
MPV: 11.1 fL (ref 7.5–11.5)
Platelets: 212 (ref 150–399)
RDW: 13
Uric Acid: 7.7
WBC: 7.5

## 2019-07-18 LAB — LIPID PANEL
Chol/HDL Ratio: 3.6
Cholesterol: 226 — AB (ref 0–200)
HDL: 63 (ref 35–70)
LDL Cholesterol: 128
Non-HDL Cholesterol (Calc): 163
Triglycerides: 210 — AB (ref 40–160)
VLDL: 42 mg/dL

## 2019-07-18 LAB — BASIC METABOLIC PANEL
BUN: 18 (ref 4–21)
Chloride: 103 (ref 99–108)
Creatinine: 1.2 (ref <=1.3)
Glucose: 124

## 2019-07-18 LAB — HEMOGLOBIN A1C: Hemoglobin A1C: 5.7

## 2019-07-18 LAB — CBC: RBC: 4.74 (ref 3.87–5.11)

## 2019-07-18 LAB — COMPREHENSIVE METABOLIC PANEL
ALBUMIN/GLOBULIN RATIO: 2
Albumin: 4.4 (ref 3.5–5.0)
Calcium: 9.4 (ref 8.7–10.7)
GFR calc non Af Amer: 64
GGT: 142
Globulin: 2.2
Total Protein: 6.6 (ref 6.4–8.2)

## 2019-07-18 LAB — HEPATIC FUNCTION PANEL
ALT: 31 (ref 10–40)
AST: 25 (ref 14–40)
Alkaline Phosphatase: 94 (ref 25–125)
Bilirubin, Direct: 0.2
Bilirubin, Total: 0.7

## 2019-07-18 LAB — IRON,TIBC AND FERRITIN PANEL: Iron: 75

## 2019-08-14 DIAGNOSIS — Z20822 Contact with and (suspected) exposure to covid-19: Secondary | ICD-10-CM | POA: Diagnosis not present

## 2019-11-04 ENCOUNTER — Other Ambulatory Visit: Payer: Self-pay | Admitting: Family Medicine

## 2019-11-04 DIAGNOSIS — I10 Essential (primary) hypertension: Secondary | ICD-10-CM

## 2019-11-15 ENCOUNTER — Ambulatory Visit: Payer: BC Managed Care – PPO | Admitting: Family Medicine

## 2019-11-15 ENCOUNTER — Encounter: Payer: Self-pay | Admitting: Family Medicine

## 2019-11-15 ENCOUNTER — Other Ambulatory Visit: Payer: Self-pay

## 2019-11-15 VITALS — BP 118/78 | HR 90 | Temp 98.5°F | Wt 195.0 lb

## 2019-11-15 DIAGNOSIS — G4700 Insomnia, unspecified: Secondary | ICD-10-CM | POA: Diagnosis not present

## 2019-11-15 DIAGNOSIS — I10 Essential (primary) hypertension: Secondary | ICD-10-CM | POA: Diagnosis not present

## 2019-11-15 DIAGNOSIS — Z79899 Other long term (current) drug therapy: Secondary | ICD-10-CM | POA: Diagnosis not present

## 2019-11-15 NOTE — Progress Notes (Signed)
Established patient visit   Patient: Christopher Rose   DOB: 10/04/1960   59 y.o. Male  MRN: [id number] Visit Date: 11/15/2019  Today's healthcare provider: Wilhemena Durie, MD   Chief Complaint  Patient presents with  . Insomnia   Subjective    Insomnia Primary symptoms: difficulty falling asleep, frequent awakening.  The problem occurs nightly. The problem has been gradually worsening (especially in the last 10 days. ) since onset. How many beverages per day that contain caffeine: 2-3.  Types of beverages you drink: soda and tea. Past treatments include meditation. The treatment provided mild relief. Typical bedtime:  10-11 P.M..  How long after going to bed to you fall asleep: over an hour.   PMH includes: no restless leg syndrome, no apnea.  Overall patient is doing well.  Is having some mild fatigue but otherwise feels okay.  Hypertension, follow-up  BP Readings from Last 3 Encounters:  11/15/19 118/78  07/05/19 113/77  05/01/19 109/70   Wt Readings from Last 3 Encounters:  11/15/19 195 lb (88.5 kg)  07/05/19 191 lb (86.6 kg)  05/01/19 193 lb (87.5 kg)     He was last seen for hypertension 6 months ago.  BP at that visit was 113/77. Management since that visit includes no changes.  He reports excellent compliance with treatment. He is not having side effects.  He is following a Low Sodium diet. He is exercising. He does smoke.  Use of agents associated with hypertension: none.   Outside blood pressures are normal at home. Symptoms: No chest pain No chest pressure  No palpitations No syncope  No dyspnea No orthopnea  No paroxysmal nocturnal dyspnea No lower extremity edema   Pertinent labs: Lab Results  Component Value Date   CHOL 226 (A) 07/18/2019   HDL 63 07/18/2019   LDLCALC 128 07/18/2019   TRIG 210 (A) 07/18/2019   CHOLHDL 3.6 07/18/2019   Lab Results  Component Value Date   NA 139 07/05/2019   K 4.5 07/05/2019   CREATININE 1.2 07/18/2019    GFRNONAA 64 07/18/2019   GFRAA 90 07/05/2019   GLUCOSE 100 (H) 07/05/2019     The 10-year ASCVD risk score Mikey Bussing DC Jr., et al., 2013) is: 8.3%* (Cholesterol units were assumed)   ---------------------------------------------------------------------------------------------------    Patient Active Problem List   Diagnosis Date Noted  . Elevated liver function tests 07/05/2019  . Cervical radiculopathy 12/06/2017  . Cervical spondylitis with radiculitis ([place]) 08/06/2017  . Hx of colonic polyps   . Benign neoplasm of descending colon   . Insomnia 03/27/2015  . Erectile dysfunction of organic origin 02/13/2015  . BPH with obstruction/lower urinary tract symptoms 02/13/2015  . Hypogonadism in male 11/12/2014  . Insomnia, persistent 09/20/2014  . ED (erectile dysfunction) of organic origin 09/20/2014  . History of prolonged Q-T interval on ECG 09/20/2014  . Internal hemorrhoids 09/20/2014  . Hypercholesteremia 09/20/2014  . BP (high blood pressure) 09/20/2014  . 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 SVT (supraventricular tachycardia) ([place]) 06/26/2008  . DIZZINESS 06/26/2008   Past Medical History:  Diagnosis Date  . Anal fissure   . Arthritis    knees  . BPH (benign prostatic hyperplasia)   . Chronic insomnia   . Chronic sinusitis   . ED (erectile dysfunction)   . HLD (hyperlipidemia)   . HTN (hypertension)   . Hypogonadism in male   . Measles   . Mumps   . Palpitations   . SVT (supraventricular tachycardia) (  [place])    Social History   Tobacco Use  . Smoking status: Never Smoker  . Smokeless tobacco: Never Used  Vaping Use  . Vaping Use: Never used  Substance Use Topics  . Alcohol use: Yes    Alcohol/week: 0.0 standard drinks    Comment: occasional - holidays  . Drug use: No   Allergies  Allergen Reactions  . Amoxicillin Rash  . Penicillins Rash and Other (See Comments)    Has patient had a PCN reaction causing immediate rash, facial/tongue/throat swelling, SOB or  lightheadedness with hypotension: No Has patient had a PCN reaction causing severe rash involving mucus membranes or skin necrosis: No Has patient had a PCN reaction that required hospitalization: No Has patient had a PCN reaction occurring within the last 10 years: No If all of the above answers are "NO", then may proceed with Cephalosporin use.      Medications: Outpatient Medications Prior to Visit  Medication Sig  . clomiPHENE (CLOMID) 50 MG tablet TAKE 1/2 TABLET BY MOUTH DAILY  . lisinopril (ZESTRIL) 10 MG tablet TAKE 1 TABLET BY MOUTH EVERY DAY  . meloxicam (MOBIC) 15 MG tablet TAKE 1 TABLET BY MOUTH EVERY DAY AS NEEDED FOR PAIN  . tadalafil (CIALIS) 20 MG tablet Take 1 tablet (20 mg total) by mouth every three (3) days as needed for erectile dysfunction. Every 3 DAYS prn  . zolpidem (AMBIEN) 10 MG tablet TAKE 1 TABLET BY MOUTH EVERY DAY   No facility-administered medications prior to visit.    Review of Systems  Constitutional: Positive for fatigue. Negative for activity change, appetite change, chills, diaphoresis and fever.  Respiratory: Negative.  Negative for apnea.   Cardiovascular: Negative.   Gastrointestinal: Positive for blood in stool (History of hemorrhoids.) and nausea. Negative for abdominal distention, abdominal pain, anal bleeding, constipation, diarrhea, rectal pain and vomiting.  Endocrine: Negative.   Neurological: Negative for dizziness, light-headedness and headaches.  Psychiatric/Behavioral: The patient has insomnia.       Objective    BP 118/78 (BP Location: Right Arm, Patient Position: Sitting, Cuff Size: Large)   Pulse 90   Temp 98.5 F (36.9 C) (Oral)   Wt 195 lb (88.5 kg)   BMI 27.98 kg/m    Physical Exam Vitals reviewed.  Constitutional:      Appearance: Normal appearance.  HENT:     Head: Normocephalic and atraumatic.  Eyes:     Conjunctiva/sclera: Conjunctivae normal.  Cardiovascular:     Rate and Rhythm: Normal rate and regular  rhythm.  Pulmonary:     Effort: Pulmonary effort is normal.     Breath sounds: Normal breath sounds.  Abdominal:     Palpations: Abdomen is soft.     Tenderness: There is no abdominal tenderness.  Musculoskeletal:     Right lower leg: No edema.     Left lower leg: No edema.  Lymphadenopathy:     Cervical: No cervical adenopathy.  Skin:    General: Skin is warm and dry.  Neurological:     General: No focal deficit present.     Mental Status: He is alert and oriented to person, place, and time.  Psychiatric:        Mood and Affect: Mood normal.        Behavior: Behavior normal.        Thought Content: Thought content normal.        Judgment: Judgment normal.       No results found for any visits  on 11/15/19.  Assessment & Plan     1. Primary hypertension  - Comp. Metabolic Panel (12)  2. Insomnia, persistent Continue to try to cut back on Ambien. - Comp. Metabolic Panel (12)  3. High risk medication use Follow-up mildly elevated liver function test. - Comp. Metabolic Panel (12)   No follow-ups on file.         Shaliyah Taite Cranford Mon, MD  [facility] [phone] (phone) [phone] (fax)  [place]

## 2019-11-16 LAB — COMP. METABOLIC PANEL (12)
AST: 23 IU/L (ref 0–40)
Albumin/Globulin Ratio: 2.3 — ABNORMAL HIGH (ref 1.2–2.2)
Albumin: 4.5 g/dL (ref 3.8–4.9)
Alkaline Phosphatase: 112 IU/L (ref 44–121)
BUN/Creatinine Ratio: 11 (ref 10–24)
BUN: 13 mg/dL (ref 8–27)
Bilirubin Total: 0.6 mg/dL (ref 0.0–1.2)
Calcium: 9.3 mg/dL (ref 8.6–10.2)
Chloride: 101 mmol/L (ref 96–106)
Creatinine, Ser: 1.22 mg/dL (ref 0.76–1.27)
GFR calc Af Amer: 74 mL/min/{1.73_m2} (ref >59)
GFR calc non Af Amer: 64 mL/min/{1.73_m2} (ref >59)
Globulin, Total: 2 g/dL (ref 1.5–4.5)
Glucose: 98 mg/dL (ref 65–99)
Potassium: 4.5 mmol/L (ref 3.5–5.2)
Sodium: 138 mmol/L (ref 134–144)
Total Protein: 6.5 g/dL (ref 6.0–8.5)

## 2019-11-17 ENCOUNTER — Telehealth: Payer: Self-pay

## 2019-11-17 NOTE — Telephone Encounter (Signed)
Copied from [place] [phone]. Topic: General - Other >> Nov 17, 2019  1:20 PM Leward Quan A wrote: Reason for CRM: Patient called in to request a call back about the booster shot. Please call  Ph# [phone]

## 2019-11-27 NOTE — Telephone Encounter (Signed)
Patient advised about the covid vaccine clinic.

## 2019-12-03 ENCOUNTER — Other Ambulatory Visit: Payer: Self-pay | Admitting: Family Medicine

## 2019-12-03 DIAGNOSIS — G4700 Insomnia, unspecified: Secondary | ICD-10-CM

## 2019-12-03 DIAGNOSIS — I10 Essential (primary) hypertension: Secondary | ICD-10-CM

## 2019-12-04 NOTE — Telephone Encounter (Signed)
Requested medication (s) are due for refill today: yes   Requested medication (s) are on the active medication list: yes   Last refill:  11/04/2019  Future visit scheduled: yes   Notes to clinic:  this refill cannot be delegated    Requested Prescriptions  Pending Prescriptions Disp Refills   zolpidem (AMBIEN) 10 MG tablet [Pharmacy Med Name: ZOLPIDEM TARTRATE 10 MG TABLET] 30 tablet 4    Sig: TAKE 1 TABLET BY MOUTH EVERY DAY      Not Delegated - Psychiatry:  Anxiolytics/Hypnotics Failed - 12/03/2019  9:36 PM      Failed - This refill cannot be delegated      Failed - Urine Drug Screen completed in last 360 days      Passed - Valid encounter within last 6 months    Recent Outpatient Visits           2 weeks ago Primary hypertension   [facility] Jerrol Banana., MD   5 months ago Essential hypertension   [facility] Jerrol Banana., MD   7 months ago Elevated liver function tests   [facility] Jerrol Banana., MD   12 months ago Annual physical exam   [facility] Jerrol Banana., MD   2 years ago Essential hypertension   [facility] Jerrol Banana., MD       Future Appointments             In 2 months McGowan, Hunt Oris, PA-C [place]   In 4 months Jerrol Banana., MD [facility]              lisinopril (ZESTRIL) 20 MG tablet [Pharmacy Med Name: LISINOPRIL 20 MG TABLET] 90 tablet 3    Sig: TAKE 1 TABLET BY MOUTH EVERY DAY      Cardiovascular:  ACE Inhibitors Passed - 12/03/2019  9:36 PM      Passed - Cr in normal range and within 180 days    Creatinine, Ser  Date Value Ref Range Status  11/15/2019 1.22 0.76 - 1.27 mg/dL Final          Passed - K in normal range and within 180 days    Potassium  Date Value Ref Range Status  11/15/2019 4.5 3.5 - 5.2 mmol/L Final          Passed - Patient is  not pregnant      Passed - Last BP in normal range    BP Readings from Last 1 Encounters:  11/15/19 118/78          Passed - Valid encounter within last 6 months    Recent Outpatient Visits           2 weeks ago Primary hypertension   [facility] Jerrol Banana., MD   5 months ago Essential hypertension   [facility] Jerrol Banana., MD   7 months ago Elevated liver function tests   [facility] Jerrol Banana., MD   12 months ago Annual physical exam   [facility] Jerrol Banana., MD   2 years ago Essential hypertension   [facility] Jerrol Banana., MD       Future Appointments             In 2 months McGowan, Gordan Payment [facility]  In 4 months Jerrol Banana., MD [facility] [place]

## 2019-12-06 NOTE — Telephone Encounter (Signed)
Pt called about status of Zolpidem refill/ pleas advise

## 2020-02-12 ENCOUNTER — Other Ambulatory Visit: Payer: Self-pay

## 2020-02-12 ENCOUNTER — Other Ambulatory Visit: Payer: BC Managed Care – PPO

## 2020-02-12 DIAGNOSIS — E349 Endocrine disorder, unspecified: Secondary | ICD-10-CM

## 2020-02-12 DIAGNOSIS — N138 Other obstructive and reflux uropathy: Secondary | ICD-10-CM | POA: Diagnosis not present

## 2020-02-12 DIAGNOSIS — N401 Enlarged prostate with lower urinary tract symptoms: Secondary | ICD-10-CM

## 2020-02-13 LAB — HEMATOCRIT: Hematocrit: 42.2 % (ref 37.5–51.0)

## 2020-02-13 LAB — HEPATIC FUNCTION PANEL
ALT: 39 IU/L (ref 0–44)
AST: 33 IU/L (ref 0–40)
Albumin: 4.4 g/dL (ref 3.8–4.9)
Alkaline Phosphatase: 124 IU/L — ABNORMAL HIGH (ref 44–121)
Bilirubin Total: 0.6 mg/dL (ref 0.0–1.2)
Bilirubin, Direct: 0.25 mg/dL (ref 0.00–0.40)
Total Protein: 6.3 g/dL (ref 6.0–8.5)

## 2020-02-13 LAB — PSA: Prostate Specific Ag, Serum: 4.4 ng/mL — ABNORMAL HIGH (ref 0.0–4.0)

## 2020-02-13 LAB — TESTOSTERONE: Testosterone: 705 ng/dL (ref 264–916)

## 2020-02-13 LAB — HEMOGLOBIN: Hemoglobin: 14.3 g/dL (ref 13.0–17.7)

## 2020-02-13 NOTE — Progress Notes (Signed)
02/22/2018 8:23 AM   Christopher Rose [birth year] [id number]  Referring provider: Jerrol Banana., MD [address] [place],  [place] [address]  Chief Complaint  Patient presents with  . Benign Prostatic Hypertrophy  . Hypogonadism   Urological history: 1.  Testosterone deficiency -Testosterone level 705 ng/dL in February 2022 - Hemoglobin hematocrit are within normal limits -Alk phos is elevated -Managed with Clomid 50 mg, 1/2 tablet daily  2. BPH with LU TS - PSA 4.4 in 02/2020 - I PSS 3/0  3. ED - SHIM 15 -Contributing factors of age, BPH, testosterone deficiency and HTN -Managed with Cialis 20 mg, 1/2 tablet     HPI: Christopher Rose is a 60 y.o. male who presents today for a one year follow up.  Patient still having spontaneous erections.  He denies any pain or curvature with erections.    Tadalafil causes back pain and sildenafil causes headache, so he uses them sparingly.   SHIM    Row Name 02/14/20 0901 02/14/20 0902       SHIM: Over the last 6 months:   How do you rate your confidence that you could get and keep an erection? Low Low    When you had erections with sexual stimulation, how often were your erections hard enough for penetration (entering your partner)? Most Times (much more than half the time) Most Times (much more than half the time)    During sexual intercourse, how often were you able to maintain your erection after you had penetrated (entered) your partner? Sometimes (about half the time) Sometimes (about half the time)    During sexual intercourse, how difficult was it to maintain your erection to completion of intercourse? Difficult Difficult    When you attempted sexual intercourse, how often was it satisfactory for you? Sometimes (about half the time) Sometimes (about half the time)         SHIM Total Score   SHIM 15 15          Score: 1-7 Severe ED 8-11 Moderate ED 12-16 Mild-Moderate ED 17-21 Mild ED 22-25 No  ED  Patient denies any modifying or aggravating factors.  Patient denies any gross hematuria, dysuria or suprapubic/flank pain.  Patient denies any fevers, chills, nausea or vomiting.   UA clear.    IPSS    Row Name 02/14/20 0800         International Prostate Symptom Score   How often have you had the sensation of not emptying your bladder? Not at All     How often have you had to urinate less than every two hours? Less than 1 in 5 times     How often have you found you stopped and started again several times when you urinated? Less than 1 in 5 times     How often have you found it difficult to postpone urination? Not at All     How often have you had a weak urinary stream? Not at All     How often have you had to strain to start urination? Not at All     How many times did you typically get up at night to urinate? 1 Time     Total IPSS Score 3           Quality of Life due to urinary symptoms   If you were to spend the rest of your life with your urinary condition just the way it is now  how would you feel about that? Delighted           Score:  1-7 Mild 8-19 Moderate 20-35 Severe  PMH: Past Medical History:  Diagnosis Date  . Anal fissure   . Arthritis    knees  . BPH (benign prostatic hyperplasia)   . Chronic insomnia   . Chronic sinusitis   . ED (erectile dysfunction)   . HLD (hyperlipidemia)   . HTN (hypertension)   . Hypogonadism in male   . Measles   . Mumps   . Palpitations   . SVT (supraventricular tachycardia) [facility])     Surgical History: Past Surgical History:  Procedure Laterality Date  . ANTERIOR CERVICAL DECOMP/DISCECTOMY FUSION N/A 12/06/2017   Procedure: ANTERIOR CERVICAL DECOMPRESSION/DISCECTOMY FUSION 1 LEVEL-C6-7;  Surgeon: Kathlene November, MD;  Location: [facility];  Service: Neurosurgery;  Laterality: N/A;  . cardiac ablation surgery  2010  . COLONOSCOPY WITH PROPOFOL N/A 12/02/2015   Procedure: COLONOSCOPY WITH PROPOFOL;  Surgeon: Lucilla Lame, MD;  Location: [place];  Service: Endoscopy;  Laterality: N/A;  . HERNIA REPAIR  1990  . POLYPECTOMY  12/02/2015   Procedure: POLYPECTOMY;  Surgeon: Lucilla Lame, MD;  Location: [place];  Service: Endoscopy;;  . VASECTOMY  1992    Home Medications:  Allergies as of 02/14/2020      Reactions   Amoxicillin Rash   Penicillins Rash, Other (See Comments)   Has patient had a PCN reaction causing immediate rash, facial/tongue/throat swelling, SOB or lightheadedness with hypotension: No Has patient had a PCN reaction causing severe rash involving mucus membranes or skin necrosis: No Has patient had a PCN reaction that required hospitalization: No Has patient had a PCN reaction occurring within the last 10 years: No If all of the above answers are "NO", then may proceed with Cephalosporin use.      Medication List       Accurate as of February 14, 2020 11:59 PM. If you have any questions, ask your nurse or doctor.        clomiPHENE 50 MG tablet Commonly known as: CLOMID TAKE 1/2 TABLET BY MOUTH DAILY   lisinopril 10 MG tablet Commonly known as: ZESTRIL TAKE 1 TABLET BY MOUTH EVERY DAY   lisinopril 20 MG tablet Commonly known as: ZESTRIL TAKE 1 TABLET BY MOUTH EVERY DAY   meloxicam 15 MG tablet Commonly known as: MOBIC TAKE 1 TABLET BY MOUTH EVERY DAY AS NEEDED FOR PAIN   tadalafil 20 MG tablet Commonly known as: Cialis Take 1 tablet (20 mg total) by mouth every three (3) days as needed for erectile dysfunction. Every 3 DAYS prn   zolpidem 10 MG tablet Commonly known as: AMBIEN TAKE 1 TABLET BY MOUTH EVERY DAY       Allergies:  Allergies  Allergen Reactions  . Amoxicillin Rash  . Penicillins Rash and Other (See Comments)    Has patient had a PCN reaction causing immediate rash, facial/tongue/throat swelling, SOB or lightheadedness with hypotension: No Has patient had a PCN reaction causing severe rash involving mucus membranes or skin  necrosis: No Has patient had a PCN reaction that required hospitalization: No Has patient had a PCN reaction occurring within the last 10 years: No If all of the above answers are "NO", then may proceed with Cephalosporin use.     Family History: Family History  Problem Relation Age of Onset  . Diabetes Mellitus II Mother   . Colon cancer Paternal Grandfather   .  Heart attack Father   . Kidney disease Neg Hx   . Prostate cancer Neg Hx   . Bladder Cancer Neg Hx   . Kidney cancer Neg Hx     Social History:  reports that he has never smoked. He has never used smokeless tobacco. He reports current alcohol use. He reports that he does not use drugs.  ROS: Pertinent review of systems can be found in history of present illness  Physical Exam: BP 125/80   Pulse 77   Ht $R'5\' 9"'Ln$  (1.753 m)   Wt 197 lb (89.4 kg)   BMI 29.09 kg/m   Constitutional:  Well nourished. Alert and oriented, No acute distress. HEENT: [place] AT, mask in place.  Trachea midline Cardiovascular: No clubbing, cyanosis, or edema. Respiratory: Normal respiratory effort, no increased work of breathing. GU: No CVA tenderness.  No bladder fullness or masses.  Patient with circumcised phallus.  Urethral meatus is patent.  No penile discharge. No penile lesions or rashes. Scrotum without lesions, cysts, rashes and/or edema.  Testicles are located scrotally bilaterally. No masses are appreciated in the testicles. Left and right epididymis are normal. Rectal: Patient with  normal sphincter tone. Anus and perineum without scarring or rashes. No rectal masses are appreciated. Prostate is approximately 50 grams, no nodules are appreciated. Seminal vesicles could not be palpated Lymph: No inguinal adenopathy. Neurologic: Grossly intact, no focal deficits, moving all 4 extremities. Psychiatric: Normal mood and affect.  Laboratory Data: Results for orders placed or performed in visit on 02/14/20  Microscopic Examination   Urine   Result Value Ref Range   WBC, UA 0-5 0 - 5 /hpf   RBC 0-2 0 - 2 /hpf   Epithelial Cells (non renal) None seen 0 - 10 /hpf   Bacteria, UA None seen None seen/Few  Urinalysis, Complete  Result Value Ref Range   Specific Gravity, UA 1.025 1.005 - 1.030   pH, UA 5.5 5.0 - 7.5   Color, UA Yellow Yellow   Appearance Ur Clear Clear   Leukocytes,UA Negative Negative   Protein,UA Negative Negative/Trace   Glucose, UA Negative Negative   Ketones, UA Negative Negative   RBC, UA Negative Negative   Bilirubin, UA Negative Negative   Urobilinogen, Ur 0.2 0.2 - 1.0 mg/dL   Nitrite, UA Negative Negative   Microscopic Examination See below:   PSA  Result Value Ref Range   Prostate Specific Ag, Serum 3.9 0.0 - 4.0 ng/mL   Lab Results  Component Value Date   WBC 7.5 07/18/2019   HGB 14.3 02/12/2020   HCT 42.2 02/12/2020   MCV 97.9 07/18/2019   PLT 212 07/18/2019    Lab Results  Component Value Date   CREATININE 1.22 11/15/2019   Lipid Panel     Component Value Date/Time   CHOL 226 (A) 07/18/2019 0000   CHOL 213 (H) 12/08/2018 1606   TRIG 210 (A) 07/18/2019 0000   HDL 63 07/18/2019 0000   HDL 51 12/08/2018 1606   CHOLHDL 3.6 07/18/2019 0000   VLDL 42 07/18/2019 0000   LDLCALC 128 07/18/2019 0000   LDLCALC 124 (H) 12/08/2018 1606    Lab Results  Component Value Date   AST 33 02/12/2020   Lab Results  Component Value Date   ALT 39 02/12/2020   Urinalysis: Component     Latest Ref Rng & Units 02/14/2020  Specific Gravity, UA     1.005 - 1.030 1.025  pH, UA     5.0 -  7.5 5.5  Color, UA     Yellow Yellow  Appearance Ur     Clear Clear  Leukocytes,UA     Negative Negative  Protein,UA     Negative/Trace Negative  Glucose, UA     Negative Negative  Ketones, UA     Negative Negative  RBC, UA     Negative Negative  Bilirubin, UA     Negative Negative  Urobilinogen, Ur     0.2 - 1.0 mg/dL 0.2  Nitrite, UA     Negative Negative  Microscopic Examination      See  below:   Component     Latest Ref Rng & Units 02/14/2020  WBC, UA     0 - 5 /hpf 0-5  RBC     0 - 2 /hpf 0-2  Epithelial Cells (non renal)     0 - 10 /hpf None seen  Bacteria, UA     None seen/Few None seen   I reviewed the labs  Assessment & Plan:    1. Testosterone deficiency: -Testosterone at therapeutic levels -Continue Clomid 50 mg / 1/2 tablet daily - refill given  2. BPH with LUTS - IPSS score is 3/0, it is slightly worse - Continue conservative management, avoiding bladder irritants and timed voiding's  3. Erectile dysfunction:    - SHIM score is 15, it is worse - Not as sexually active at this time due to Covid pandemic  4. Elevated PSA - Discussed with the patient that PSA is an acronym for  prostate specific antigen,  which is a protein made by the prostate gland and can be detected in the blood stream. I explained to the patient situations that would increase the PSA, such as: a man's age,  BPH, infection, recent intercourse/ejaculation, prostate infarction, recent urethroscopic manipulation (Foley placement/cystoscopy) and prostate cancer -PSA repeated today -If PSA remains elevated, we discussed undergoing a prostate biopsy in the office or having a prostate MRI for a more targeted biopsy in [place] -If PSA returns below 4, we will continue to monitor to ensure downward trend continues and repeat the PSA in 1 month -If PSA returns to baseline, we will repeat the PSA in 6 months for closer monitoring -We will have further discussion once the repeated PSA results are available  Return for Pending repeated PSA results .  Zara Council, PA-C  [facility] [address], [place], [place] [address] [phone]

## 2020-02-14 ENCOUNTER — Encounter: Payer: Self-pay | Admitting: Urology

## 2020-02-14 ENCOUNTER — Other Ambulatory Visit: Payer: Self-pay

## 2020-02-14 ENCOUNTER — Ambulatory Visit: Payer: BC Managed Care – PPO | Admitting: Urology

## 2020-02-14 ENCOUNTER — Other Ambulatory Visit: Payer: Self-pay | Admitting: Urology

## 2020-02-14 VITALS — BP 125/80 | HR 77 | Ht 69.0 in | Wt 197.0 lb

## 2020-02-14 DIAGNOSIS — N138 Other obstructive and reflux uropathy: Secondary | ICD-10-CM | POA: Diagnosis not present

## 2020-02-14 DIAGNOSIS — N401 Enlarged prostate with lower urinary tract symptoms: Secondary | ICD-10-CM

## 2020-02-14 DIAGNOSIS — E349 Endocrine disorder, unspecified: Secondary | ICD-10-CM | POA: Diagnosis not present

## 2020-02-14 DIAGNOSIS — N529 Male erectile dysfunction, unspecified: Secondary | ICD-10-CM

## 2020-02-14 LAB — URINALYSIS, COMPLETE
Bilirubin, UA: NEGATIVE
Glucose, UA: NEGATIVE
Ketones, UA: NEGATIVE
Leukocytes,UA: NEGATIVE
Nitrite, UA: NEGATIVE
Protein,UA: NEGATIVE
RBC, UA: NEGATIVE
Specific Gravity, UA: 1.025 (ref 1.005–1.030)
Urobilinogen, Ur: 0.2 mg/dL (ref 0.2–1.0)
pH, UA: 5.5 (ref 5.0–7.5)

## 2020-02-14 LAB — MICROSCOPIC EXAMINATION
Bacteria, UA: NONE SEEN
Epithelial Cells (non renal): NONE SEEN /hpf (ref 0–10)

## 2020-02-14 MED ORDER — CLOMIPHENE CITRATE 50 MG PO TABS
ORAL_TABLET | ORAL | 3 refills | Status: DC
Start: 2020-02-14 — End: 2021-04-07

## 2020-02-15 ENCOUNTER — Telehealth: Payer: Self-pay | Admitting: Family Medicine

## 2020-02-15 LAB — PSA: Prostate Specific Ag, Serum: 3.9 ng/mL (ref 0.0–4.0)

## 2020-02-15 NOTE — Telephone Encounter (Signed)
Patient notified and appointment has been made.  

## 2020-02-15 NOTE — Telephone Encounter (Signed)
-----   Message from Nori Riis, PA-C sent at 02/15/2020  7:54 AM EST ----- Please let Mr. Kibble know that his PSA has come down a bit.  I would like it repeated in one month to ensure the downward trend continues.

## 2020-03-11 ENCOUNTER — Other Ambulatory Visit: Payer: Self-pay

## 2020-03-11 DIAGNOSIS — N138 Other obstructive and reflux uropathy: Secondary | ICD-10-CM

## 2020-03-11 DIAGNOSIS — N401 Enlarged prostate with lower urinary tract symptoms: Secondary | ICD-10-CM

## 2020-03-12 ENCOUNTER — Other Ambulatory Visit: Payer: Self-pay

## 2020-03-12 ENCOUNTER — Other Ambulatory Visit: Payer: BC Managed Care – PPO

## 2020-03-12 DIAGNOSIS — N138 Other obstructive and reflux uropathy: Secondary | ICD-10-CM | POA: Diagnosis not present

## 2020-03-12 DIAGNOSIS — N401 Enlarged prostate with lower urinary tract symptoms: Secondary | ICD-10-CM | POA: Diagnosis not present

## 2020-03-13 ENCOUNTER — Telehealth: Payer: Self-pay | Admitting: Family Medicine

## 2020-03-13 DIAGNOSIS — E349 Endocrine disorder, unspecified: Secondary | ICD-10-CM

## 2020-03-13 DIAGNOSIS — N138 Other obstructive and reflux uropathy: Secondary | ICD-10-CM

## 2020-03-13 DIAGNOSIS — N401 Enlarged prostate with lower urinary tract symptoms: Secondary | ICD-10-CM

## 2020-03-13 LAB — PSA: Prostate Specific Ag, Serum: 2 ng/mL (ref 0.0–4.0)

## 2020-03-13 NOTE — Telephone Encounter (Signed)
LMOM for patient to return call.

## 2020-03-13 NOTE — Telephone Encounter (Signed)
Patient notified and scheduled labs ordered

## 2020-03-13 NOTE — Telephone Encounter (Signed)
-----   Message from Nori Riis, PA-C sent at 03/13/2020  9:49 AM EST ----- Please let Mr. Blocher know that his PSA is back to baseline.  I would like to see him in 6 months for repeat PSA, am testosterone, HCT, hbg, I PSS, SHIM and exam - blood work prior.

## 2020-04-23 ENCOUNTER — Encounter: Payer: BC Managed Care – PPO | Admitting: Family Medicine

## 2020-05-03 ENCOUNTER — Other Ambulatory Visit: Payer: Self-pay | Admitting: Family Medicine

## 2020-05-03 DIAGNOSIS — G4700 Insomnia, unspecified: Secondary | ICD-10-CM

## 2020-05-03 NOTE — Telephone Encounter (Signed)
Requested medication (s) are due for refill today - yes  Requested medication (s) are on the active medication list -yes  Future visit scheduled -yes  Last refill: 12/06/19 #30 4 RF  Notes to clinic: Request non delegated Rx  Requested Prescriptions  Pending Prescriptions Disp Refills   zolpidem (AMBIEN) 10 MG tablet [Pharmacy Med Name: ZOLPIDEM TARTRATE 10 MG TABLET] 30 tablet 4    Sig: TAKE 1 TABLET BY MOUTH EVERY DAY      Not Delegated - Psychiatry:  Anxiolytics/Hypnotics Failed - 05/03/2020 10:16 PM      Failed - This refill cannot be delegated      Failed - Urine Drug Screen completed in last 360 days      Passed - Valid encounter within last 6 months    Recent Outpatient Visits           5 months ago Primary hypertension   [facility] Jerrol Banana., MD   10 months ago Essential hypertension   [facility] Jerrol Banana., MD   1 year ago Elevated liver function tests   [facility] Jerrol Banana., MD   1 year ago Annual physical exam   [facility] Jerrol Banana., MD   2 years ago Essential hypertension   [facility] Jerrol Banana., MD       Future Appointments             In 3 months Jerrol Banana., MD [facility] [place]   In 4 months McGowan, Gordan [facility]                 Requested Prescriptions  Pending Prescriptions Disp Refills   zolpidem (AMBIEN) 10 MG tablet [Pharmacy Med Name: ZOLPIDEM TARTRATE 10 MG TABLET] 30 tablet 4    Sig: TAKE 1 TABLET BY MOUTH EVERY DAY      Not Delegated - Psychiatry:  Anxiolytics/Hypnotics Failed - 05/03/2020 10:16 PM      Failed - This refill cannot be delegated      Failed - Urine Drug Screen completed in last 360 days      Passed - Valid encounter within last 6 months    Recent Outpatient Visits           5 months ago Primary hypertension    [facility] Jerrol Banana., MD   10 months ago Essential hypertension   [facility] Jerrol Banana., MD   1 year ago Elevated liver function tests   [facility] Jerrol Banana., MD   1 year ago Annual physical exam   [facility] Jerrol Banana., MD   2 years ago Essential hypertension   [facility] Jerrol Banana., MD       Future Appointments             In 3 months Jerrol Banana., MD [facility] [place]   In 4 months McGowan, Gordan Payment [facility]

## 2020-08-20 NOTE — Progress Notes (Signed)
Complete physical exam   Patient: Christopher Rose   DOB: 02-17-1960   60 y.o. Male  MRN: [id number] Visit Date: 08/21/2020  Today's healthcare provider: Wilhemena Durie, MD   Chief Complaint  Patient presents with   Annual Exam   Subjective    Christopher Rose is a 60 y.o. male who presents today for a complete physical exam.  He reports consuming a general diet. The patient does not participate in regular exercise at present. He generally feels well. He reports sleeping well. He does not have additional problems to discuss today.  He is generally active.  Plays golf about twice per week. His GGT on recent lab work was 163 but he felt well and has been imbibing in alcohol some for the couple of weeks before the labs drawn.   Past Medical History:  Diagnosis Date   Anal fissure    Arthritis    knees   BPH (benign prostatic hyperplasia)    Chronic insomnia    Chronic sinusitis    ED (erectile dysfunction)    HLD (hyperlipidemia)    HTN (hypertension)    Hypogonadism in male    Measles    Mumps    Palpitations    SVT (supraventricular tachycardia) ([place])    Past Surgical History:  Procedure Laterality Date   ANTERIOR CERVICAL DECOMP/DISCECTOMY FUSION N/A 12/06/2017   Procedure: ANTERIOR CERVICAL DECOMPRESSION/DISCECTOMY FUSION 1 LEVEL-C6-7;  Surgeon: Kathlene November, MD;  Location: [facility];  Service: Neurosurgery;  Laterality: N/A;   cardiac ablation surgery  2010   COLONOSCOPY WITH PROPOFOL N/A 12/02/2015   Procedure: COLONOSCOPY WITH PROPOFOL;  Surgeon: Lucilla Lame, MD;  Location: [place];  Service: Endoscopy;  Laterality: N/A;   HERNIA REPAIR  1990   POLYPECTOMY  12/02/2015   Procedure: POLYPECTOMY;  Surgeon: Lucilla Lame, MD;  Location: [place];  Service: Endoscopy;;   VASECTOMY  1992   Social History   Socioeconomic History   Marital status: Married    Spouse name: Not on file   Number of children: Not on file   Years of education: Not  on file   Highest education level: Not on file  Occupational History   Not on file  Tobacco Use   Smoking status: Never   Smokeless tobacco: Never  Vaping Use   Vaping Use: Never used  Substance and Sexual Activity   Alcohol use: Yes    Alcohol/week: 0.0 standard drinks    Comment: occasional - holidays   Drug use: No   Sexual activity: Yes  Other Topics Concern   Not on file  Social History Narrative   Not on file   Social Determinants of Health   Financial Resource Strain: Not on file  Food Insecurity: Not on file  Transportation Needs: Not on file  Physical Activity: Not on file  Stress: Not on file  Social Connections: Not on file  Intimate Partner Violence: Not on file   Family Status  Relation Name Status   Mother  (Not Specified)   PGF  (Not Specified)   Father  (Not Specified)   Neg Hx  (Not Specified)   Family History  Problem Relation Age of Onset   Diabetes Mellitus II Mother    Colon cancer Paternal Grandfather    Heart attack Father    Kidney disease Neg Hx    Prostate cancer Neg Hx    Bladder Cancer Neg Hx    Kidney cancer Neg Hx  Allergies  Allergen Reactions   Amoxicillin Rash   Penicillins Rash and Other (See Comments)    Has patient had a PCN reaction causing immediate rash, facial/tongue/throat swelling, SOB or lightheadedness with hypotension: No Has patient had a PCN reaction causing severe rash involving mucus membranes or skin necrosis: No Has patient had a PCN reaction that required hospitalization: No Has patient had a PCN reaction occurring within the last 10 years: No If all of the above answers are "NO", then may proceed with Cephalosporin use.     Patient Care Team: Jerrol Banana., MD as PCP - General (Family Medicine)   Medications: Outpatient Medications Prior to Visit  Medication Sig   clomiPHENE (CLOMID) 50 MG tablet TAKE 1/2 TABLET BY MOUTH DAILY   lisinopril (ZESTRIL) 20 MG tablet TAKE 1 TABLET BY MOUTH  EVERY DAY   meloxicam (MOBIC) 15 MG tablet TAKE 1 TABLET BY MOUTH EVERY DAY AS NEEDED FOR PAIN   tadalafil (CIALIS) 20 MG tablet Take 1 tablet (20 mg total) by mouth every three (3) days as needed for erectile dysfunction. Every 3 DAYS prn   zolpidem (AMBIEN) 10 MG tablet TAKE 1 TABLET BY MOUTH EVERY DAY   No facility-administered medications prior to visit.    Review of Systems  All other systems reviewed and are negative.     Objective    BP 123/79   Pulse 73   Temp 98.6 F (37 C)   Resp 16   Ht '5\' 9"'$  (1.753 m)   Wt 195 lb (88.5 kg)   BMI 28.80 kg/m  BP Readings from Last 3 Encounters:  08/21/20 123/79  02/14/20 125/80  11/15/19 118/78   Wt Readings from Last 3 Encounters:  08/21/20 195 lb (88.5 kg)  02/14/20 197 lb (89.4 kg)  11/15/19 195 lb (88.5 kg)      Physical Exam Vitals reviewed.  Constitutional:      Appearance: He is well-developed.  HENT:     Head: Normocephalic and atraumatic.     Right Ear: External ear normal.     Left Ear: External ear normal.  Eyes:     General: No scleral icterus.    Conjunctiva/sclera: Conjunctivae normal.  Neck:     Thyroid: No thyromegaly.  Cardiovascular:     Rate and Rhythm: Normal rate and regular rhythm.     Heart sounds: Normal heart sounds.  Pulmonary:     Effort: Pulmonary effort is normal.     Breath sounds: Normal breath sounds.  Abdominal:     Palpations: Abdomen is soft.  Skin:    General: Skin is warm and dry.     Comments: Solar keratosis on sun exposed areas  Neurological:     General: No focal deficit present.     Mental Status: He is alert and oriented to person, place, and time.  Psychiatric:        Mood and Affect: Mood normal.        Behavior: Behavior normal.        Thought Content: Thought content normal.        Judgment: Judgment normal.      Last depression screening scores PHQ 2/9 Scores 08/21/2020 12/08/2018 11/17/2016  PHQ - 2 Score 0 0 0  PHQ- 9 Score '4 2 2   '$ Last fall risk  screening Fall Risk  08/21/2020  Falls in the past year? 0  Number falls in past yr: 0  Injury with Fall? 0  Risk for fall due  to : No Fall Risks  Follow up Falls evaluation completed   Last Audit-C alcohol use screening Alcohol Use Disorder Test (AUDIT) 08/21/2020  1. How often do you have a drink containing alcohol? 2  2. How many drinks containing alcohol do you have on a typical day when you are drinking? 0  3. How often do you have six or more drinks on one occasion? 0  AUDIT-C Score 2  Alcohol Brief Interventions/Follow-up -   A score of 3 or more in women, and 4 or more in men indicates increased risk for alcohol abuse, EXCEPT if all of the points are from question 1   No results found for any visits on 08/21/20.  Assessment & Plan    Routine Health Maintenance and Physical Exam  Exercise Activities and Dietary recommendations  Goals   None     Immunization History  Administered Date(s) Administered   Influenza Inj Mdck Quad Pf 10/22/2016   Influenza Inj Mdck Quad With Preservative 10/10/2017   Influenza,inj,Quad PF,6+ Mos 10/03/2015, 10/03/2018   Influenza-Unspecified 10/22/2016, 10/04/2018   PFIZER(Purple Top)SARS-COV-2 Vaccination 03/12/2019, 04/04/2019   Tdap 08/11/2012    Health Maintenance  Topic Date Due   Pneumococcal Vaccine 78-55 Years old (1 - PCV) Never done   HIV Screening  Never done   Zoster Vaccines- Shingrix (1 of 2) Never done   COVID-19 Vaccine (3 - Pfizer risk series) 05/02/2019   INFLUENZA VACCINE  08/05/2020   COLONOSCOPY (Pts 45-59yr Insurance coverage will need to be confirmed)  12/01/2020   TETANUS/TDAP  08/12/2022   Hepatitis C Screening  Completed   HPV VACCINES  Aged Out    Discussed health benefits of physical activity, and encouraged him to engage in regular exercise appropriate for his age and condition.  1. Annual physical exam   2. Elevated liver function tests Abstain from alcohol repeat hepatic panel in 2 months  3.  Solar keratosis For to Derm - Ambulatory referral to Dermatology  4. Primary hypertension Good control  5. Hypogonadism in male   659 Hypercholesteremia   7. Cervical radiculopathy Clinically much improved   No follow-ups on file.     I, RWilhemena Durie MD, have reviewed all documentation for this visit. The documentation on 08/30/20 for the exam, diagnosis, procedures, and orders are all accurate and complete.    Gustav Knueppel GCranford Mon MD  [phone](phone) [phone](fax)  [place]

## 2020-08-21 ENCOUNTER — Other Ambulatory Visit: Payer: Self-pay

## 2020-08-21 ENCOUNTER — Encounter: Payer: Self-pay | Admitting: Family Medicine

## 2020-08-21 ENCOUNTER — Ambulatory Visit (INDEPENDENT_AMBULATORY_CARE_PROVIDER_SITE_OTHER): Payer: BC Managed Care – PPO | Admitting: Family Medicine

## 2020-08-21 VITALS — BP 123/79 | HR 73 | Temp 98.6°F | Resp 16 | Ht 69.0 in | Wt 195.0 lb

## 2020-08-21 DIAGNOSIS — Z0000 Encounter for general adult medical examination without abnormal findings: Secondary | ICD-10-CM | POA: Diagnosis not present

## 2020-08-21 DIAGNOSIS — L570 Actinic keratosis: Secondary | ICD-10-CM

## 2020-08-21 DIAGNOSIS — E7800 Pure hypercholesterolemia, unspecified: Secondary | ICD-10-CM

## 2020-08-21 DIAGNOSIS — I10 Essential (primary) hypertension: Secondary | ICD-10-CM | POA: Diagnosis not present

## 2020-08-21 DIAGNOSIS — R7989 Other specified abnormal findings of blood chemistry: Secondary | ICD-10-CM

## 2020-08-21 DIAGNOSIS — E291 Testicular hypofunction: Secondary | ICD-10-CM

## 2020-08-21 DIAGNOSIS — M5412 Radiculopathy, cervical region: Secondary | ICD-10-CM

## 2020-09-05 ENCOUNTER — Other Ambulatory Visit: Payer: BC Managed Care – PPO

## 2020-09-05 ENCOUNTER — Other Ambulatory Visit: Payer: Self-pay

## 2020-09-05 DIAGNOSIS — N401 Enlarged prostate with lower urinary tract symptoms: Secondary | ICD-10-CM

## 2020-09-05 DIAGNOSIS — N138 Other obstructive and reflux uropathy: Secondary | ICD-10-CM | POA: Diagnosis not present

## 2020-09-05 DIAGNOSIS — E349 Endocrine disorder, unspecified: Secondary | ICD-10-CM

## 2020-09-06 ENCOUNTER — Other Ambulatory Visit: Payer: BC Managed Care – PPO

## 2020-09-06 ENCOUNTER — Other Ambulatory Visit: Payer: Self-pay

## 2020-09-06 LAB — PSA: Prostate Specific Ag, Serum: 2.1 ng/mL (ref 0.0–4.0)

## 2020-09-06 LAB — TESTOSTERONE: Testosterone: 906 ng/dL (ref 264–916)

## 2020-09-06 LAB — HEMOGLOBIN AND HEMATOCRIT, BLOOD
Hematocrit: 41.3 % (ref 37.5–51.0)
Hemoglobin: 14 g/dL (ref 13.0–17.7)

## 2020-09-11 NOTE — Progress Notes (Signed)
02/22/2018 3:58 PM   [place] 08-Jul-1960 [id number]  Referring provider: Jerrol Banana., MD [address] [place],  [place] [address]  Urological history: 1.  Testosterone deficiency -Testosterone level 906 ng/dL in 09/2020 -Hemoglobin and hematocrit are within normal limits -LFT's stable 09/2020 -Managed with Clomid 50 mg, 1/2 tablet daily  2. BPH with LU TS -PSA 2.1 in 02/2020 -I PSS 1/0  3. ED -Contributing factors of age, BPH, testosterone deficiency and HTN -SHIM 20 -Managed with Cialis 20 mg, 1/2 tablet   Chief Complaint  Patient presents with   Hypogonadism    HPI: Christopher Rose is a 60 y.o. male who presents today for a one year follow up.  Patient still having spontaneous erections.  He denies any pain or curvature with erections.    SHIM     Row Name 09/12/20 1526 09/12/20 1533       SHIM: Over the last 6 months:   How do you rate your confidence that you could get and keep an erection? Moderate Moderate    When you had erections with sexual stimulation, how often were your erections hard enough for penetration (entering your partner)? Most Times (much more than half the time) Most Times (much more than half the time)    During sexual intercourse, how often were you able to maintain your erection after you had penetrated (entered) your partner? Most Times (much more than half the time) Most Times (much more than half the time)    During sexual intercourse, how difficult was it to maintain your erection to completion of intercourse? Slightly Difficult Slightly Difficult    When you attempted sexual intercourse, how often was it satisfactory for you? Almost Always or Always Almost Always or Always         SHIM Total Score   SHIM 20 20             Score: 1-7 Severe ED 8-11 Moderate ED 12-16 Mild-Moderate ED 17-21 Mild ED 22-25 No ED  No urinary complaints.  Patient denies any modifying or aggravating factors.  Patient  denies any gross hematuria, dysuria or suprapubic/flank pain.  Patient denies any fevers, chills, nausea or vomiting.    IPSS     Row Name 09/12/20 1500         International Prostate Symptom Score   How often have you had the sensation of not emptying your bladder? Not at All     How often have you had to urinate less than every two hours? Less than 1 in 5 times     How often have you found you stopped and started again several times when you urinated? Not at All     How often have you found it difficult to postpone urination? Not at All     How often have you had a weak urinary stream? Not at All     How often have you had to strain to start urination? Not at All     How many times did you typically get up at night to urinate? None     Total IPSS Score 1           Quality of Life due to urinary symptoms   If you were to spend the rest of your life with your urinary condition just the way it is now how would you feel about that? Delighted  Score:  1-7 Mild 8-19 Moderate 20-35 Severe  PMH: Past Medical History:  Diagnosis Date   Anal fissure    Arthritis    knees   BPH (benign prostatic hyperplasia)    Chronic insomnia    Chronic sinusitis    ED (erectile dysfunction)    HLD (hyperlipidemia)    HTN (hypertension)    Hypogonadism in male    Measles    Mumps    Palpitations    SVT (supraventricular tachycardia) ([place])     Surgical History: Past Surgical History:  Procedure Laterality Date   ANTERIOR CERVICAL DECOMP/DISCECTOMY FUSION N/A 12/06/2017   Procedure: ANTERIOR CERVICAL DECOMPRESSION/DISCECTOMY FUSION 1 LEVEL-C6-7;  Surgeon: Kathlene November, MD;  Location: [facility];  Service: Neurosurgery;  Laterality: N/A;   cardiac ablation surgery  2010   COLONOSCOPY WITH PROPOFOL N/A 12/02/2015   Procedure: COLONOSCOPY WITH PROPOFOL;  Surgeon: Lucilla Lame, MD;  Location: [place];  Service: Endoscopy;  Laterality: N/A;   HERNIA REPAIR  1990    POLYPECTOMY  12/02/2015   Procedure: POLYPECTOMY;  Surgeon: Lucilla Lame, MD;  Location: [place];  Service: Endoscopy;;   VASECTOMY  1992    Home Medications:  Allergies as of 09/12/2020       Reactions   Amoxicillin Rash   Penicillins Rash, Other (See Comments)   Has patient had a PCN reaction causing immediate rash, facial/tongue/throat swelling, SOB or lightheadedness with hypotension: No Has patient had a PCN reaction causing severe rash involving mucus membranes or skin necrosis: No Has patient had a PCN reaction that required hospitalization: No Has patient had a PCN reaction occurring within the last 10 years: No If all of the above answers are "NO", then may proceed with Cephalosporin use.        Medication List        Accurate as of September 12, 2020  3:58 PM. If you have any questions, ask your nurse or doctor.          clomiPHENE 50 MG tablet Commonly known as: CLOMID TAKE 1/2 TABLET BY MOUTH DAILY   lisinopril 20 MG tablet Commonly known as: ZESTRIL TAKE 1 TABLET BY MOUTH EVERY DAY   meloxicam 15 MG tablet Commonly known as: MOBIC TAKE 1 TABLET BY MOUTH EVERY DAY AS NEEDED FOR PAIN   tadalafil 20 MG tablet Commonly known as: Cialis Take 1 tablet (20 mg total) by mouth every three (3) days as needed for erectile dysfunction. Every 3 DAYS prn   zolpidem 10 MG tablet Commonly known as: AMBIEN TAKE 1 TABLET BY MOUTH EVERY DAY        Allergies:  Allergies  Allergen Reactions   Amoxicillin Rash   Penicillins Rash and Other (See Comments)    Has patient had a PCN reaction causing immediate rash, facial/tongue/throat swelling, SOB or lightheadedness with hypotension: No Has patient had a PCN reaction causing severe rash involving mucus membranes or skin necrosis: No Has patient had a PCN reaction that required hospitalization: No Has patient had a PCN reaction occurring within the last 10 years: No If all of the above answers are "NO",  then may proceed with Cephalosporin use.     Family History: Family History  Problem Relation Age of Onset   Diabetes Mellitus II Mother    Colon cancer Paternal Grandfather    Heart attack Father    Kidney disease Neg Hx    Prostate cancer Neg Hx    Bladder Cancer Neg Hx  Kidney cancer Neg Hx     Social History:  reports that he has never smoked. He has never used smokeless tobacco. He reports current alcohol use. He reports that he does not use drugs.  ROS: Pertinent review of systems can be found in history of present illness  Physical Exam: BP 123/82   Pulse 72   Ht '5\' 9"'$  (1.753 m)   Wt 195 lb (88.5 kg)   BMI 28.80 kg/m   Constitutional:  Well nourished. Alert and oriented, No acute distress. HEENT: [place] AT, mask in place.  Trachea midline Cardiovascular: No clubbing, cyanosis, or edema. Respiratory: Normal respiratory effort, no increased work of breathing. GU: No CVA tenderness.  No bladder fullness or masses.  Patient with circumcised phallus. Urethral meatus is patent.  No penile discharge. No penile lesions or rashes. Scrotum without lesions, cysts, rashes and/or edema.  Testicles are located scrotally bilaterally. No masses are appreciated in the testicles. Left and right epididymis are normal. Rectal: Patient with  normal sphincter tone. Anus and perineum without scarring or rashes. No rectal masses are appreciated. Prostate is approximately 50 grams, no nodules are appreciated. Seminal vesicles could not be palpated Neurologic: Grossly intact, no focal deficits, moving all 4 extremities. Psychiatric: Normal mood and affect.   Laboratory Data: Results for orders placed or performed in visit on 09/05/20  Hemoglobin and Hematocrit, Blood  Result Value Ref Range   Hemoglobin 14.0 13.0 - 17.7 g/dL   Hematocrit 41.3 37.5 - 51.0 %  PSA  Result Value Ref Range   Prostate Specific Ag, Serum 2.1 0.0 - 4.0 ng/mL  Testosterone  Result Value Ref Range   Testosterone 906  264 - 916 ng/dL    Lab Results  Component Value Date   CREATININE 1.22 11/15/2019    Lab Results  Component Value Date   AST 33 02/12/2020   Lab Results  Component Value Date   ALT 39 02/12/2020  I reviewed the labs  Assessment & Plan:    1. Testosterone deficiency: -Testosterone at therapeutic levels -Continue Clomid 50 mg / 1/2 tablet daily   2. BPH with LUTS -PSA stable -DRE benign   3. Erectile dysfunction:    -continue tadalafil 20 mg, on-demand-dosing  Return in about 6 months (around 03/12/2021) for PSA, testosterone, LFT's and  H & Venda Rodes, PA-C  [place] [address] [place], [place] [address] [phone]

## 2020-09-12 ENCOUNTER — Ambulatory Visit: Payer: BC Managed Care – PPO | Admitting: Urology

## 2020-09-12 ENCOUNTER — Encounter: Payer: Self-pay | Admitting: Urology

## 2020-09-12 ENCOUNTER — Other Ambulatory Visit: Payer: Self-pay

## 2020-09-12 VITALS — BP 123/82 | HR 72 | Ht 69.0 in | Wt 195.0 lb

## 2020-09-12 DIAGNOSIS — N138 Other obstructive and reflux uropathy: Secondary | ICD-10-CM | POA: Diagnosis not present

## 2020-09-12 DIAGNOSIS — N401 Enlarged prostate with lower urinary tract symptoms: Secondary | ICD-10-CM | POA: Diagnosis not present

## 2020-09-12 DIAGNOSIS — N529 Male erectile dysfunction, unspecified: Secondary | ICD-10-CM

## 2020-09-12 DIAGNOSIS — E349 Endocrine disorder, unspecified: Secondary | ICD-10-CM | POA: Diagnosis not present

## 2020-10-02 ENCOUNTER — Other Ambulatory Visit: Payer: Self-pay | Admitting: Family Medicine

## 2020-10-02 DIAGNOSIS — G4700 Insomnia, unspecified: Secondary | ICD-10-CM

## 2020-10-02 NOTE — Telephone Encounter (Signed)
Requested medications are due for refill today.  yes  Requested medications are on the active medications list.  yes  Last refill. 05/08/2020  Future visit scheduled.   yes  Notes to clinic.  Medication not delegated.

## 2020-10-07 NOTE — Telephone Encounter (Signed)
Please advise refill?  LOV: 08/21/2020 NOV: 12/05/2020 Last refill: 05/18/2020 #30 w/4 refills

## 2020-11-07 ENCOUNTER — Other Ambulatory Visit: Payer: Self-pay | Admitting: Family Medicine

## 2020-11-07 DIAGNOSIS — I10 Essential (primary) hypertension: Secondary | ICD-10-CM

## 2020-11-08 NOTE — Telephone Encounter (Signed)
Requested Prescriptions  Pending Prescriptions Disp Refills  . lisinopril (ZESTRIL) 20 MG tablet [Pharmacy Med Name: LISINOPRIL 20 MG TABLET] 90 tablet 0    Sig: TAKE 1 TABLET BY MOUTH EVERY DAY     Cardiovascular:  ACE Inhibitors Failed - 11/07/2020  6:00 PM      Failed - Cr in normal range and within 180 days    Creatinine, Ser  Date Value Ref Range Status  11/15/2019 1.22 0.76 - 1.27 mg/dL Final         Failed - K in normal range and within 180 days    Potassium  Date Value Ref Range Status  11/15/2019 4.5 3.5 - 5.2 mmol/L Final         Passed - Patient is not pregnant      Passed - Last BP in normal range    BP Readings from Last 1 Encounters:  09/12/20 123/82         Passed - Valid encounter within last 6 months    Recent Outpatient Visits          2 months ago Annual physical exam   [facility] Jerrol Banana., MD   11 months ago Primary hypertension   [facility] Jerrol Banana., MD   1 year ago Essential hypertension   [facility] Jerrol Banana., MD   1 year ago Elevated liver function tests   [facility] Jerrol Banana., MD   1 year ago Annual physical exam   [facility] Jerrol Banana., MD      Future Appointments            In 3 weeks Jerrol Banana., MD [facility], [place]   In 3 months Ralene Bathe, MD [place]

## 2020-12-01 ENCOUNTER — Other Ambulatory Visit: Payer: Self-pay | Admitting: Family Medicine

## 2020-12-02 MED ORDER — MELOXICAM 15 MG PO TABS: 15.0000 mg | ORAL_TABLET | Freq: Every day | ORAL | 0 refills | Status: DC

## 2020-12-02 NOTE — Telephone Encounter (Signed)
LOV: 08/21/2020  NOV:  12/24/2020   Last Refills:  11/22/2018  # 90 0 refills.   Thanks,   -Mickel Baas

## 2020-12-05 ENCOUNTER — Ambulatory Visit: Payer: BC Managed Care – PPO | Admitting: Family Medicine

## 2020-12-09 ENCOUNTER — Ambulatory Visit: Payer: BC Managed Care – PPO | Admitting: Family Medicine

## 2020-12-23 NOTE — Progress Notes (Signed)
Established patient visit   Patient: Christopher Rose   DOB: 1960-01-13   60 y.o. Male  MRN: [id number] Visit Date: 12/24/2020  Today's healthcare provider: Gwyneth Sprout, FNP   Chief Complaint  Patient presents with   Hypertension   Subjective    HPI  Elevated Liver Enzymes-Patient was last seen in the office 4 months ago.  At that time he was asked to abstain from ETOH and follow up with labs in 2 months.  Labs have been ordered however, patient has not yet had them done.   Hypertension, follow-up  BP Readings from Last 3 Encounters:  12/24/20 120/86  09/12/20 123/82  08/21/20 123/79   Wt Readings from Last 3 Encounters:  12/24/20 187 lb (84.8 kg)  09/12/20 195 lb (88.5 kg)  08/21/20 195 lb (88.5 kg)      He reports excellent compliance with treatment. He is not having side effects.  He is following a Regular diet. He is exercising. He does not smoke.  Use of agents associated with hypertension: none.   Outside blood pressures are 130's/70's. Symptoms: No chest pain No chest pressure  No palpitations No syncope  No dyspnea No orthopnea  No paroxysmal nocturnal dyspnea No lower extremity edema   Pertinent labs: Lab Results  Component Value Date   CHOL 226 (A) 07/18/2019   HDL 63 07/18/2019   LDLCALC 128 07/18/2019   TRIG 210 (A) 07/18/2019   CHOLHDL 3.6 07/18/2019   Lab Results  Component Value Date   NA 138 11/15/2019   K 4.5 11/15/2019   CREATININE 1.22 11/15/2019   GFRNONAA 64 11/15/2019   GLUCOSE 98 11/15/2019   TSH 1.080 12/08/2018     The 10-year ASCVD risk score (Arnett DK, et al., 2019) is: 9.2%* (Cholesterol units were assumed)   ---------------------------------------------------------------------------------------------------   Medications: Outpatient Medications Prior to Visit  Medication Sig   clomiPHENE (CLOMID) 50 MG tablet TAKE 1/2 TABLET BY MOUTH DAILY   lisinopril (ZESTRIL) 20 MG tablet TAKE 1 TABLET BY MOUTH EVERY DAY    meloxicam (MOBIC) 15 MG tablet Take 1 tablet (15 mg total) by mouth daily.   tadalafil (CIALIS) 20 MG tablet Take 1 tablet (20 mg total) by mouth every three (3) days as needed for erectile dysfunction. Every 3 DAYS prn   zolpidem (AMBIEN) 10 MG tablet TAKE 1 TABLET BY MOUTH EVERY DAY   No facility-administered medications prior to visit.    Review of Systems  Constitutional: Negative.   Respiratory: Negative.    Cardiovascular: Negative.   Gastrointestinal: Negative.   Neurological:  Negative for dizziness, light-headedness and headaches.      Objective    BP 120/86 (BP Location: Right Arm, Patient Position: Sitting, Cuff Size: Large)    Pulse 86    Temp 98.5 F (36.9 C) (Oral)    Wt 187 lb (84.8 kg)    SpO2 98%    BMI 27.62 kg/m    Physical Exam Vitals and nursing note reviewed.  Constitutional:      Appearance: Normal appearance. He is overweight.  HENT:     Head: Normocephalic and atraumatic.  Eyes:     Pupils: Pupils are equal, round, and reactive to light.  Cardiovascular:     Rate and Rhythm: Normal rate and regular rhythm.     Pulses: Normal pulses.     Heart sounds: Normal heart sounds.  Pulmonary:     Effort: Pulmonary effort is normal.     Breath  sounds: Normal breath sounds.  Musculoskeletal:        General: No swelling. Normal range of motion.     Cervical back: Normal range of motion.     Right lower leg: No edema.     Left lower leg: No edema.  Skin:    General: Skin is warm and dry.     Capillary Refill: Capillary refill takes less than 2 seconds.  Neurological:     General: No focal deficit present.     Mental Status: He is alert and oriented to person, place, and time. Mental status is at baseline.  Psychiatric:        Mood and Affect: Mood normal.        Behavior: Behavior normal.        Thought Content: Thought content normal.        Judgment: Judgment normal.      No results found for any visits on 12/24/20.  Assessment & Plan      Problem List Items Addressed This Visit       Cardiovascular and Mediastinum   BP (high blood pressure)    Chronic, stable Does not check outside of medical office No CP, SOB, no tachyarrhythmias, no LE edema/swellings         Other   Elevated cholesterol    Not on medication Has changed exercise habits Not fasting today- repeat lipid panel given previous elevations and known HTN      Relevant Orders   Lipid panel   Elevated liver function tests - Primary    Denies excess alcohol use Likely medication related Continue to routinely monitor      Relevant Orders   Hepatic function panel   Comprehensive metabolic panel   Encounter for screening for HIV    Low risk screening Completed today      Relevant Orders   HIV antibody (with reflex)   Prediabetes    8# weight loss with biking since weather changes; too cold for golf Repeat A1c Continue to recommend balanced, lower carb meals. Smaller meal size, adding snacks. Choosing water as drink of choice and increasing purposeful exercise.       Relevant Orders   Hemoglobin A1c     Return for annual examination- 08/2021.      Vonna Kotyk, FNP, have reviewed all documentation for this visit. The documentation on 12/24/20 for the exam, diagnosis, procedures, and orders are all accurate and complete.    Gwyneth Sprout, [address] [phone] (phone) [phone] (fax)  [place]

## 2020-12-24 ENCOUNTER — Ambulatory Visit: Payer: BC Managed Care – PPO | Admitting: Family Medicine

## 2020-12-24 ENCOUNTER — Other Ambulatory Visit: Payer: Self-pay

## 2020-12-24 ENCOUNTER — Encounter: Payer: Self-pay | Admitting: Family Medicine

## 2020-12-24 VITALS — BP 120/86 | HR 86 | Temp 98.5°F | Wt 187.0 lb

## 2020-12-24 DIAGNOSIS — Z114 Encounter for screening for human immunodeficiency virus [HIV]: Secondary | ICD-10-CM | POA: Diagnosis not present

## 2020-12-24 DIAGNOSIS — E7800 Pure hypercholesterolemia, unspecified: Secondary | ICD-10-CM

## 2020-12-24 DIAGNOSIS — R7303 Prediabetes: Secondary | ICD-10-CM

## 2020-12-24 DIAGNOSIS — I10 Essential (primary) hypertension: Secondary | ICD-10-CM | POA: Diagnosis not present

## 2020-12-24 DIAGNOSIS — R7989 Other specified abnormal findings of blood chemistry: Secondary | ICD-10-CM

## 2020-12-24 NOTE — Assessment & Plan Note (Signed)
Denies excess alcohol use Likely medication related Continue to routinely monitor

## 2020-12-24 NOTE — Assessment & Plan Note (Signed)
Chronic, stable No concerns at this time Continue to monitor

## 2020-12-24 NOTE — Assessment & Plan Note (Signed)
Low risk screening Completed today

## 2020-12-24 NOTE — Assessment & Plan Note (Signed)
8# weight loss with biking since weather changes; too cold for golf Repeat A1c Continue to recommend balanced, lower carb meals. Smaller meal size, adding snacks. Choosing water as drink of choice and increasing purposeful exercise.

## 2020-12-24 NOTE — Assessment & Plan Note (Signed)
Chronic, stable Does not check outside of medical office No CP, SOB, no tachyarrhythmias, no LE edema/swellings

## 2020-12-24 NOTE — Assessment & Plan Note (Signed)
Not on medication Has changed exercise habits Not fasting today- repeat lipid panel given previous elevations and known HTN

## 2020-12-25 LAB — COMPREHENSIVE METABOLIC PANEL
ALT: 26 IU/L (ref 0–44)
AST: 25 IU/L (ref 0–40)
Albumin/Globulin Ratio: 2.4 — ABNORMAL HIGH (ref 1.2–2.2)
Albumin: 4.6 g/dL (ref 3.8–4.8)
Alkaline Phosphatase: 108 IU/L (ref 44–121)
BUN/Creatinine Ratio: 18 (ref 10–24)
BUN: 23 mg/dL (ref 8–27)
Bilirubin Total: 1 mg/dL (ref 0.0–1.2)
CO2: 25 mmol/L (ref 20–29)
Calcium: 9.1 mg/dL (ref 8.6–10.2)
Chloride: 102 mmol/L (ref 96–106)
Creatinine, Ser: 1.28 mg/dL — ABNORMAL HIGH (ref 0.76–1.27)
Globulin, Total: 1.9 g/dL (ref 1.5–4.5)
Glucose: 137 mg/dL — ABNORMAL HIGH (ref 70–99)
Potassium: 4.8 mmol/L (ref 3.5–5.2)
Sodium: 141 mmol/L (ref 134–144)
Total Protein: 6.5 g/dL (ref 6.0–8.5)
eGFR: 64 mL/min/{1.73_m2} (ref >59)

## 2020-12-25 LAB — HIV ANTIBODY (ROUTINE TESTING W REFLEX): HIV Screen 4th Generation wRfx: NONREACTIVE

## 2020-12-25 LAB — HEMOGLOBIN A1C
Est. average glucose Bld gHb Est-mCnc: 117 mg/dL
Hgb A1c MFr Bld: 5.7 % — ABNORMAL HIGH (ref 4.8–5.6)

## 2020-12-25 LAB — LIPID PANEL
Chol/HDL Ratio: 3.9 ratio (ref 0.0–5.0)
Cholesterol, Total: 179 mg/dL (ref 100–199)
HDL: 46 mg/dL (ref >39)
LDL Chol Calc (NIH): 102 mg/dL — ABNORMAL HIGH (ref 0–99)
Triglycerides: 176 mg/dL — ABNORMAL HIGH (ref 0–149)
VLDL Cholesterol Cal: 31 mg/dL (ref 5–40)

## 2020-12-25 LAB — HEPATIC FUNCTION PANEL: Bilirubin, Direct: 0.51 mg/dL — ABNORMAL HIGH (ref 0.00–0.40)

## 2021-02-02 ENCOUNTER — Other Ambulatory Visit: Payer: Self-pay | Admitting: Family Medicine

## 2021-02-02 DIAGNOSIS — I10 Essential (primary) hypertension: Secondary | ICD-10-CM

## 2021-02-03 NOTE — Telephone Encounter (Signed)
Requested Prescriptions  Pending Prescriptions Disp Refills   lisinopril (ZESTRIL) 20 MG tablet [Pharmacy Med Name: LISINOPRIL 20 MG TABLET] 90 tablet 0    Sig: TAKE 1 TABLET BY MOUTH EVERY DAY     Cardiovascular:  ACE Inhibitors Failed - 02/02/2021 12:15 PM      Failed - Cr in normal range and within 180 days    Creatinine, Ser  Date Value Ref Range Status  12/24/2020 1.28 (H) 0.76 - 1.27 mg/dL Final         Passed - K in normal range and within 180 days    Potassium  Date Value Ref Range Status  12/24/2020 4.8 3.5 - 5.2 mmol/L Final         Passed - Patient is not pregnant      Passed - Last BP in normal range    BP Readings from Last 1 Encounters:  12/24/20 120/86         Passed - Valid encounter within last 6 months    Recent Outpatient Visits          1 month ago Elevated liver function tests   [facility] Gwyneth Sprout, FNP   5 months ago Annual physical exam   [facility] Jerrol Banana., MD   1 year ago Primary hypertension   [facility] Jerrol Banana., MD   1 year ago Essential hypertension   [facility] Jerrol Banana., MD   1 year ago Elevated liver function tests   [facility] Jerrol Banana., MD      Future Appointments            In 3 weeks Ralene Bathe, MD [place]

## 2021-02-24 ENCOUNTER — Ambulatory Visit: Payer: BC Managed Care – PPO | Admitting: Dermatology

## 2021-03-05 ENCOUNTER — Other Ambulatory Visit: Payer: Self-pay | Admitting: Family Medicine

## 2021-03-05 NOTE — Telephone Encounter (Signed)
Elevated lab results addressed in lab notes. ?Requested Prescriptions  ?Pending Prescriptions Disp Refills  ?? meloxicam (MOBIC) 15 MG tablet [Pharmacy Med Name: MELOXICAM 15 MG TABLET] 90 tablet 0  ?  Sig: TAKE 1 TABLET (15 MG TOTAL) BY MOUTH DAILY.  ?  ? Analgesics:  COX2 Inhibitors Failed - 03/05/2021  1:39 AM  ?  ?  Failed - Manual Review: Labs are only required if the patient has taken medication for more than 8 weeks.  ?  ?  Failed - Cr in normal range and within 360 days  ?  Creatinine, Ser  ?Date Value Ref Range Status  ?12/24/2020 1.28 (H) 0.76 - 1.27 mg/dL Final  ?   ?  ?  Passed - HGB in normal range and within 360 days  ?  Hemoglobin  ?Date Value Ref Range Status  ?09/05/2020 14.0 13.0 - 17.7 g/dL Final  ?   ?  ?  Passed - HCT in normal range and within 360 days  ?  Hematocrit  ?Date Value Ref Range Status  ?09/05/2020 41.3 37.5 - 51.0 % Final  ?   ?  ?  Passed - AST in normal range and within 360 days  ?  AST  ?Date Value Ref Range Status  ?12/24/2020 25 0 - 40 IU/L Final  ?   ?  ?  Passed - ALT in normal range and within 360 days  ?  ALT  ?Date Value Ref Range Status  ?12/24/2020 26 0 - 44 IU/L Final  ?   ?  ?  Passed - eGFR is 30 or above and within 360 days  ?  GFR calc Af Amer  ?Date Value Ref Range Status  ?11/15/2019 74 >59 mL/min/1.73 Final  ?  Comment:  ?  **In accordance with recommendations from the NKF-ASN Task force,** ?  [facility] is in the process of updating its eGFR calculation to the ?  2021 CKD-EPI creatinine equation that estimates kidney function ?  without a race variable. ?  ? ?GFR calc non Af Amer  ?Date Value Ref Range Status  ?11/15/2019 64 >59 mL/min/1.73 Final  ? ?eGFR  ?Date Value Ref Range Status  ?12/24/2020 64 >59 mL/min/1.73 Final  ?   ?  ?  Passed - Patient is not pregnant  ?  ?  Passed - Valid encounter within last 12 months  ?  Recent Outpatient Visits   ?      ? 2 months ago Elevated liver function tests  ? [facility] Tally Joe T, FNP  ? 6 months ago  Annual physical exam  ? [facility] Jerrol Banana., MD  ? 1 year ago Primary hypertension  ? [facility] Jerrol Banana., MD  ? 1 year ago Essential hypertension  ? [facility] Jerrol Banana., MD  ? 1 year ago Elevated liver function tests  ? [facility] Jerrol Banana., MD  ?  ?  ?Future Appointments   ?        ? In 1 week Ralene Bathe, MD [place]  ?  ? ?  ?  ?  ? ?

## 2021-03-07 ENCOUNTER — Other Ambulatory Visit: Payer: Self-pay | Admitting: Family Medicine

## 2021-03-07 DIAGNOSIS — G4700 Insomnia, unspecified: Secondary | ICD-10-CM

## 2021-03-10 NOTE — Telephone Encounter (Signed)
Requested medication (s) are due for refill today: yes ? ?Requested medication (s) are on the active medication list: yes ? ?Last refill:  10/07/20 #30 with 4 RF ? ?Future visit scheduled: no ? ?Notes to clinic:  This medication can not be delegated, please assess.  ? ? ? ?  ? ?Requested Prescriptions  ?Pending Prescriptions Disp Refills  ? zolpidem (AMBIEN) 10 MG tablet [Pharmacy Med Name: ZOLPIDEM TARTRATE 10 MG TABLET] 30 tablet 4  ?  Sig: TAKE 1 TABLET BY MOUTH EVERY DAY  ?  ? Not Delegated - Psychiatry:  Anxiolytics/Hypnotics Failed - 03/07/2021  8:50 PM  ?  ?  Failed - This refill cannot be delegated  ?  ?  Failed - Urine Drug Screen completed in last 360 days  ?  ?  Passed - Valid encounter within last 6 months  ?  Recent Outpatient Visits   ? ?      ? 2 months ago Elevated liver function tests  ? [facility] Tally Joe T, FNP  ? 6 months ago Annual physical exam  ? [facility] Jerrol Banana., MD  ? 1 year ago Primary hypertension  ? [facility] Jerrol Banana., MD  ? 1 year ago Essential hypertension  ? [facility] Jerrol Banana., MD  ? 1 year ago Elevated liver function tests  ? [facility] Jerrol Banana., MD  ? ?  ?  ?Future Appointments   ? ?        ? In 3 days Ralene Bathe, MD [place]  ? ?  ? ?  ?  ?  ? ? ?

## 2021-03-13 ENCOUNTER — Other Ambulatory Visit: Payer: Self-pay

## 2021-03-13 ENCOUNTER — Ambulatory Visit: Payer: BC Managed Care – PPO | Admitting: Dermatology

## 2021-03-13 DIAGNOSIS — L578 Other skin changes due to chronic exposure to nonionizing radiation: Secondary | ICD-10-CM | POA: Diagnosis not present

## 2021-03-13 DIAGNOSIS — D229 Melanocytic nevi, unspecified: Secondary | ICD-10-CM

## 2021-03-13 DIAGNOSIS — Z1283 Encounter for screening for malignant neoplasm of skin: Secondary | ICD-10-CM

## 2021-03-13 DIAGNOSIS — L821 Other seborrheic keratosis: Secondary | ICD-10-CM

## 2021-03-13 DIAGNOSIS — L814 Other melanin hyperpigmentation: Secondary | ICD-10-CM

## 2021-03-13 DIAGNOSIS — D1800 Hemangioma unspecified site: Secondary | ICD-10-CM

## 2021-03-13 NOTE — Telephone Encounter (Signed)
Pt called back in stating he is still waiting for his medication, and requesting if someone could please send it over to the pharmacy. Please advise.  ?

## 2021-03-13 NOTE — Patient Instructions (Signed)

## 2021-03-13 NOTE — Progress Notes (Unsigned)
° °  New Patient Visit  Subjective  Christopher Rose is a 61 y.o. male who presents for the following: Skin Problem (The patient has spots, moles and lesions to be evaluated, some may be new or changing and the patient has concerns that these could be cancer. ).    The following portions of the chart were reviewed this encounter and updated as appropriate:       Review of Systems:  No other skin or systemic complaints except as noted in HPI or Assessment and Plan.  Objective  Well appearing patient in no apparent distress; mood and affect are within normal limits.  All skin waist up examined.  left post shoulder Stuck-on, waxy, tan-brown papule or plaque --Discussed benign etiology and prognosis.     Assessment & Plan  Seborrheic keratosis left post shoulder  Reassured benign age-related growth.  Recommend observation.  Discussed cryotherapy if spot(s) become irritated or inflamed.    Lentigines - Scattered tan macules - Due to sun exposure - Benign-appearing, observe - Recommend daily broad spectrum sunscreen SPF 30+ to sun-exposed areas, reapply every 2 hours as needed. - Call for any changes   Melanocytic Nevi - Tan-brown and/or pink-flesh-colored symmetric macules and papules - Benign appearing on exam today - Observation - Call clinic for new or changing moles - Recommend daily use of broad spectrum spf 30+ sunscreen to sun-exposed areas.   Hemangiomas - Red papules - Discussed benign nature - Observe - Call for any changes  Actinic Damage - Chronic condition, secondary to cumulative UV/sun exposure - diffuse scaly erythematous macules with underlying dyspigmentation - Recommend daily broad spectrum sunscreen SPF 30+ to sun-exposed areas, reapply every 2 hours as needed.  - Staying in the shade or wearing long sleeves, sun glasses (UVA+UVB protection) and wide brim hats (4-inch brim around the entire circumference of the hat) are also recommended for sun  protection.  - Call for new or changing lesions.  Skin cancer screening performed today.    Return in about 1 year (around 03/14/2022) for TBSE, SKs.   I, Marye Round, CMA, am acting as scribe for Sarina Ser, MD .

## 2021-03-14 ENCOUNTER — Other Ambulatory Visit: Payer: BC Managed Care – PPO

## 2021-03-14 DIAGNOSIS — N138 Other obstructive and reflux uropathy: Secondary | ICD-10-CM | POA: Diagnosis not present

## 2021-03-14 DIAGNOSIS — E349 Endocrine disorder, unspecified: Secondary | ICD-10-CM | POA: Diagnosis not present

## 2021-03-14 DIAGNOSIS — N401 Enlarged prostate with lower urinary tract symptoms: Secondary | ICD-10-CM | POA: Diagnosis not present

## 2021-03-15 ENCOUNTER — Other Ambulatory Visit: Payer: Self-pay | Admitting: Urology

## 2021-03-15 DIAGNOSIS — E349 Endocrine disorder, unspecified: Secondary | ICD-10-CM

## 2021-03-15 LAB — HEPATIC FUNCTION PANEL
ALT: 29 IU/L (ref 0–44)
AST: 22 IU/L (ref 0–40)
Albumin: 4.8 g/dL (ref 3.8–4.8)
Alkaline Phosphatase: 125 IU/L — ABNORMAL HIGH (ref 44–121)
Bilirubin Total: 0.4 mg/dL (ref 0.0–1.2)
Bilirubin, Direct: 0.2 mg/dL (ref 0.00–0.40)
Total Protein: 6.8 g/dL (ref 6.0–8.5)

## 2021-03-15 LAB — TESTOSTERONE: Testosterone: 702 ng/dL (ref 264–916)

## 2021-03-15 LAB — HEMOGLOBIN AND HEMATOCRIT, BLOOD
Hematocrit: 44.2 % (ref 37.5–51.0)
Hemoglobin: 15 g/dL (ref 13.0–17.7)

## 2021-03-15 LAB — PSA: Prostate Specific Ag, Serum: 2.6 ng/mL (ref 0.0–4.0)

## 2021-03-19 ENCOUNTER — Encounter: Payer: Self-pay | Admitting: Dermatology

## 2021-04-04 ENCOUNTER — Other Ambulatory Visit: Payer: Self-pay | Admitting: Family Medicine

## 2021-04-04 ENCOUNTER — Other Ambulatory Visit: Payer: Self-pay | Admitting: Urology

## 2021-04-04 DIAGNOSIS — E349 Endocrine disorder, unspecified: Secondary | ICD-10-CM

## 2021-04-04 DIAGNOSIS — I10 Essential (primary) hypertension: Secondary | ICD-10-CM

## 2021-04-07 ENCOUNTER — Telehealth: Payer: Self-pay | Admitting: Urology

## 2021-04-07 DIAGNOSIS — E349 Endocrine disorder, unspecified: Secondary | ICD-10-CM

## 2021-04-07 DIAGNOSIS — N401 Enlarged prostate with lower urinary tract symptoms: Secondary | ICD-10-CM

## 2021-04-07 DIAGNOSIS — N529 Male erectile dysfunction, unspecified: Secondary | ICD-10-CM

## 2021-04-07 NOTE — Telephone Encounter (Signed)
Requested Prescriptions  ?Pending Prescriptions Disp Refills  ?? lisinopril (ZESTRIL) 20 MG tablet [Pharmacy Med Name: LISINOPRIL 20 MG TABLET] 90 tablet 0  ?  Sig: TAKE 1 TABLET BY MOUTH EVERY DAY  ?  ? Cardiovascular:  ACE Inhibitors Failed - 04/04/2021  4:33 PM  ?  ?  Failed - Cr in normal range and within 180 days  ?  Creatinine, Ser  ?Date Value Ref Range Status  ?12/24/2020 1.28 (H) 0.76 - 1.27 mg/dL Final  ?   ?  ?  Passed - K in normal range and within 180 days  ?  Potassium  ?Date Value Ref Range Status  ?12/24/2020 4.8 3.5 - 5.2 mmol/L Final  ?   ?  ?  Passed - Patient is not pregnant  ?  ?  Passed - Last BP in normal range  ?  BP Readings from Last 1 Encounters:  ?12/24/20 120/86  ?   ?  ?  Passed - Valid encounter within last 6 months  ?  Recent Outpatient Visits   ?      ? 3 months ago Elevated liver function tests  ? [facility] Gwyneth Sprout, FNP  ? 7 months ago Annual physical exam  ? [facility] Jerrol Banana., MD  ? 1 year ago Primary hypertension  ? [facility] Jerrol Banana., MD  ? 1 year ago Essential hypertension  ? [facility] Jerrol Banana., MD  ? 1 year ago Elevated liver function tests  ? [facility] Jerrol Banana., MD  ?  ?  ? ?  ?  ?  ? ?

## 2021-04-07 NOTE — Telephone Encounter (Signed)
Spoke with patient and advised results Appt scheduled 

## 2021-04-07 NOTE — Telephone Encounter (Signed)
I gave Christopher Rose a refill on his Clomid, but we need to get him scheduled for an appointment in September for PSA, hepatic panel, testosterone, H & H, I PSS, SHIM and exam - blood work prior in the am.   ?

## 2021-05-16 ENCOUNTER — Other Ambulatory Visit: Payer: Self-pay | Admitting: Family Medicine

## 2021-05-16 DIAGNOSIS — G4700 Insomnia, unspecified: Secondary | ICD-10-CM

## 2021-05-19 NOTE — Telephone Encounter (Signed)
Requested medication (s) are due for refill today: yes ? ?Requested medication (s) are on the active medication list: yes ? ?Last refill:  03/16/21 #30 1 RF ? ?Future visit scheduled: no ? ?Notes to clinic:  med not delegated to NT to RF ? ? ?Requested Prescriptions  ?Pending Prescriptions Disp Refills  ? zolpidem (AMBIEN) 10 MG tablet [Pharmacy Med Name: ZOLPIDEM TARTRATE 10 MG TABLET] 30 tablet   ?  Sig: TAKE 1 TABLET BY MOUTH EVERY DAY  ?  ? Not Delegated - Psychiatry:  Anxiolytics/Hypnotics Failed - 05/16/2021  9:56 PM  ?  ?  Failed - This refill cannot be delegated  ?  ?  Failed - Urine Drug Screen completed in last 360 days  ?  ?  Passed - Valid encounter within last 6 months  ?  Recent Outpatient Visits   ? ?      ? 4 months ago Elevated liver function tests  ? [facility] Gwyneth Sprout, FNP  ? 9 months ago Annual physical exam  ? [facility] Jerrol Banana., MD  ? 1 year ago Primary hypertension  ? [facility] Jerrol Banana., MD  ? 1 year ago Essential hypertension  ? [facility] Jerrol Banana., MD  ? 2 years ago Elevated liver function tests  ? [facility] Jerrol Banana., MD  ? ?  ?  ?Future Appointments   ? ?        ? In 4 months McGowan, Gordan Payment [place]  ? ?  ? ? ?  ?  ?  ? ? ? ? ?

## 2021-05-21 ENCOUNTER — Ambulatory Visit: Payer: Self-pay

## 2021-05-21 NOTE — Telephone Encounter (Signed)
?  Chief Complaint: med refill ?Symptoms: NA ?Frequency: NA ?Pertinent Negatives: NA ?Disposition: '[]'$ ED /'[]'$ Urgent Care (no appt availability in office) / '[]'$ Appointment(In office/virtual)/ '[]'$  [place] [facility]/ '[]'$ Home Care/ '[]'$ Refused Recommended Disposition /'[]'$ [place] [facility]/ '[x]'$  Follow-up with PCP ?Additional Notes: pt was calling to check status of refill since he requested a few days ago. I advised him it was sent to provider yesterday and pharmacy had f/up this morning as well but I would send HP to PCP to see if we could refill today since he has been out for 3-4 days now. No further assistance needed.  ? ?Summary: rx follow up  ? The patient would like to speak with a member of staff when possible  ? ?The patient would like to follow up on their previously requested zolpidem (AMBIEN) 10 MG tablet [[id number]]  ? ?Please contact further   ?  ? ?Reason for Disposition ? Caller requesting a CONTROLLED substance prescription refill (e.g., narcotics, ADHD medicines) ? ?Answer Assessment - Initial Assessment Questions ?1. DRUG NAME: "What medicine do you need to have refilled?" ?    ambien ?2. REFILLS REMAINING: "How many refills are remaining?" (Note: The label on the medicine or pill bottle will show how many refills are remaining. If there are no refills remaining, then a renewal may be needed.) ?    0 ?4. PRESCRIBING HCP: "Who prescribed it?" Reason: If prescribed by specialist, call should be referred to that group. ?    Dr. Rosanna Randy ? ?Protocols used: Medication Refill and Renewal Call-A-AH ? ?

## 2021-05-23 ENCOUNTER — Other Ambulatory Visit: Payer: Self-pay | Admitting: Family Medicine

## 2021-05-23 DIAGNOSIS — G4700 Insomnia, unspecified: Secondary | ICD-10-CM

## 2021-05-23 MED ORDER — ZOLPIDEM TARTRATE 10 MG PO TABS: 10.0000 mg | ORAL_TABLET | Freq: Every evening | ORAL | 0 refills | Status: DC | PRN

## 2021-06-03 ENCOUNTER — Other Ambulatory Visit: Payer: Self-pay | Admitting: Family Medicine

## 2021-06-10 NOTE — Progress Notes (Signed)
Established patient visit   Patient: Christopher Rose   DOB: 06/29/1960   61 y.o. Male  MRN: [id number] Visit Date: 06/11/2021  Today's healthcare provider: Wilhemena Durie, MD   No chief complaint on file.  Subjective    HPI  Patient is a 61 year old male who presents for follow up of insomnia.  He uses Ambien to aid in his sleep.  He states he is not having any side effects and on some nights he takes only half a pill.  He states he needs Ambien most nights.  Medications: Outpatient Medications Prior to Visit  Medication Sig   clomiPHENE (CLOMID) 50 MG tablet TAKE 1/2 TABLET BY MOUTH DAILY *NOT COVERED BY INS   lisinopril (ZESTRIL) 20 MG tablet TAKE 1 TABLET BY MOUTH EVERY DAY   meloxicam (MOBIC) 15 MG tablet TAKE 1 TABLET (15 MG TOTAL) BY MOUTH DAILY.   tadalafil (CIALIS) 20 MG tablet Take 1 tablet (20 mg total) by mouth every three (3) days as needed for erectile dysfunction. Every 3 DAYS prn   zolpidem (AMBIEN) 10 MG tablet TAKE 1 TABLET BY MOUTH EVERY DAY   [DISCONTINUED] zolpidem (AMBIEN) 10 MG tablet Take 1 tablet (10 mg total) by mouth at bedtime as needed for sleep.   No facility-administered medications prior to visit.    Review of Systems  Constitutional:  Negative for appetite change, chills and fever.  Respiratory:  Negative for chest tightness, shortness of breath and wheezing.   Cardiovascular:  Negative for chest pain and palpitations.  Gastrointestinal:  Negative for abdominal pain, nausea and vomiting.  Psychiatric/Behavioral:  Positive for sleep disturbance.    Last lipids Lab Results  Component Value Date   CHOL 179 12/24/2020   HDL 46 12/24/2020   LDLCALC 102 (H) 12/24/2020   TRIG 176 (H) 12/24/2020   CHOLHDL 3.9 12/24/2020       Objective    BP 132/80 (BP Location: Left Arm, Patient Position: Sitting, Cuff Size: Normal)   Pulse 65   Temp 98.4 F (36.9 C) (Oral)   Wt 193 lb (87.5 kg)   SpO2 99%   BMI 28.50 kg/m  BP Readings from Last  3 Encounters:  06/11/21 132/80  12/24/20 120/86  09/12/20 123/82   Wt Readings from Last 3 Encounters:  06/11/21 193 lb (87.5 kg)  12/24/20 187 lb (84.8 kg)  09/12/20 195 lb (88.5 kg)      Physical Exam Vitals reviewed.  Constitutional:      General: He is not in acute distress.    Appearance: He is well-developed.  HENT:     Head: Normocephalic and atraumatic.     Right Ear: Hearing normal.     Left Ear: Hearing normal.     Nose: Nose normal.  Eyes:     General: Lids are normal. No scleral icterus.       Right eye: No discharge.        Left eye: No discharge.     Conjunctiva/sclera: Conjunctivae normal.  Cardiovascular:     Rate and Rhythm: Normal rate and regular rhythm.     Heart sounds: Normal heart sounds.  Pulmonary:     Effort: Pulmonary effort is normal. No respiratory distress.  Skin:    Findings: No lesion or rash.  Neurological:     General: No focal deficit present.     Mental Status: He is alert and oriented to person, place, and time.  Psychiatric:  Mood and Affect: Mood normal.        Speech: Speech normal.        Behavior: Behavior normal.        Thought Content: Thought content normal.        Judgment: Judgment normal.       No results found for any visits on 06/11/21.  Assessment & Plan     1. Insomnia, unspecified type Patient to start to cut back on Ambien as he has till retirement. - zolpidem (AMBIEN) 10 MG tablet; Take 1 tablet (10 mg total) by mouth at bedtime as needed for sleep.  Dispense: 30 tablet; Refill: 5  2. Primary hypertension Good Blood pressure control.  3. Elevated cholesterol    No follow-ups on file.      I, Wilhemena Durie, MD, have reviewed all documentation for this visit. The documentation on 06/20/21 for the exam, diagnosis, procedures, and orders are all accurate and complete.    Trellis Vanoverbeke Cranford Mon, MD  [facility] [phone] (phone) [phone] (fax)  [place]

## 2021-06-11 ENCOUNTER — Encounter: Payer: Self-pay | Admitting: Family Medicine

## 2021-06-11 ENCOUNTER — Ambulatory Visit: Payer: BC Managed Care – PPO | Admitting: Family Medicine

## 2021-06-11 VITALS — BP 132/80 | HR 65 | Temp 98.4°F | Wt 193.0 lb

## 2021-06-11 DIAGNOSIS — I10 Essential (primary) hypertension: Secondary | ICD-10-CM | POA: Diagnosis not present

## 2021-06-11 DIAGNOSIS — E7800 Pure hypercholesterolemia, unspecified: Secondary | ICD-10-CM | POA: Diagnosis not present

## 2021-06-11 DIAGNOSIS — G4700 Insomnia, unspecified: Secondary | ICD-10-CM | POA: Diagnosis not present

## 2021-06-11 MED ORDER — ZOLPIDEM TARTRATE 10 MG PO TABS: 10.0000 mg | ORAL_TABLET | Freq: Every evening | ORAL | 5 refills | Status: DC | PRN

## 2021-07-12 ENCOUNTER — Other Ambulatory Visit: Payer: Self-pay | Admitting: Family Medicine

## 2021-07-12 ENCOUNTER — Other Ambulatory Visit: Payer: Self-pay | Admitting: Urology

## 2021-07-12 DIAGNOSIS — I10 Essential (primary) hypertension: Secondary | ICD-10-CM

## 2021-07-12 DIAGNOSIS — E349 Endocrine disorder, unspecified: Secondary | ICD-10-CM

## 2021-07-14 NOTE — Telephone Encounter (Signed)
last RF 06/03/21 #90 too soon - should have enough to last until August  Requested Prescriptions  Refused Prescriptions Disp Refills  . meloxicam (MOBIC) 15 MG tablet [Pharmacy Med Name: MELOXICAM 15 MG TABLET] 90 tablet 0    Sig: TAKE 1 TABLET (15 MG TOTAL) BY MOUTH DAILY.     Analgesics:  COX2 Inhibitors Failed - 07/12/2021  8:55 AM      Failed - Manual Review: Labs are only required if the patient has taken medication for more than 8 weeks.      Failed - Cr in normal range and within 360 days    Creatinine, Ser  Date Value Ref Range Status  12/24/2020 1.28 (H) 0.76 - 1.27 mg/dL Final         Passed - HGB in normal range and within 360 days    Hemoglobin  Date Value Ref Range Status  03/14/2021 15.0 13.0 - 17.7 g/dL Final         Passed - HCT in normal range and within 360 days    Hematocrit  Date Value Ref Range Status  03/14/2021 44.2 37.5 - 51.0 % Final         Passed - AST in normal range and within 360 days    AST  Date Value Ref Range Status  03/14/2021 22 0 - 40 IU/L Final         Passed - ALT in normal range and within 360 days    ALT  Date Value Ref Range Status  03/14/2021 29 0 - 44 IU/L Final         Passed - eGFR is 30 or above and within 360 days    GFR calc Af Amer  Date Value Ref Range Status  11/15/2019 74 >59 mL/min/1.73 Final    Comment:    **In accordance with recommendations from the NKF-ASN Task force,**   [facility] is in the process of updating its eGFR calculation to the   2021 CKD-EPI creatinine equation that estimates kidney function   without a race variable.    GFR calc non Af Amer  Date Value Ref Range Status  11/15/2019 64 >59 mL/min/1.73 Final   eGFR  Date Value Ref Range Status  12/24/2020 64 >59 mL/min/1.73 Final         Passed - Patient is not pregnant      Passed - Valid encounter within last 12 months    Recent Outpatient Visits          1 month ago Primary hypertension   [facility] Jerrol Banana., MD   6 months ago Elevated liver function tests   [facility] Gwyneth Sprout, FNP   10 months ago Annual physical exam   [facility] Jerrol Banana., MD   1 year ago Primary hypertension   [facility] Jerrol Banana., MD   2 years ago Essential hypertension   [facility] Jerrol Banana., MD      Future Appointments            In 1 month Jerrol Banana., MD [facility] [place]   In 2 months McGowan, Gordan Payment [facility]

## 2021-07-15 LAB — LIPID PANEL
ALBUMIN/GLOBULIN RATIO: 2.2
Chol/HDL Ratio: 3.9
Cholesterol: 194 (ref 0–200)
GGT: 88
HDL: 50 (ref 35–70)
LDH: 140
LDL Cholesterol: 108
MCH: 31.1 pg (ref 26.5–32.5)
MCHC: 32 g/dL — AB (ref 32.5–36.9)
MCV: 97.3 fL (ref 76.0–111.0)
MPV: 10.9 fL (ref 7.8–11.0)
Non HDL Cholesterol: 144
Phosphorus: 3.1
RDW: 12.9 % (ref 11.6–14.0)
Triglycerides: 235 — AB (ref 40–160)
Uric Acid: 6.9
VLDL Cholesterol Cal: 47

## 2021-07-15 LAB — BASIC METABOLIC PANEL
BUN: 19 (ref 4–21)
Chloride: 105 (ref 99–108)
Creatinine: 1.3 (ref 0.6–1.3)
Glucose: 123
Potassium: 4.1 mEq/L (ref 3.5–5.1)
Sodium: 141 (ref 137–147)

## 2021-07-15 LAB — HEPATIC FUNCTION PANEL
ALT: 24 U/L (ref 10–40)
AST: 20 (ref 14–40)
Alkaline Phosphatase: 101 (ref 25–125)
Bilirubin, Direct: 0.2 (ref 0.01–0.4)
Bilirubin, Total: 0.6

## 2021-07-15 LAB — IRON,TIBC AND FERRITIN PANEL: Iron: 85

## 2021-07-15 LAB — CBC AND DIFFERENTIAL
HCT: 46 (ref 41–53)
Hemoglobin: 14.7 (ref 13.5–17.5)
Platelets: 177 10*3/uL (ref 150–400)
WBC: 7.4

## 2021-07-15 LAB — CBC: RBC: 4.73 (ref 3.87–5.11)

## 2021-07-15 LAB — COMPREHENSIVE METABOLIC PANEL
Calcium: 8.8 (ref 8.7–10.7)
Globulin: 1.9
eGFR: 65

## 2021-07-15 LAB — HEMOGLOBIN A1C: Hemoglobin A1C: 5.7

## 2021-07-15 LAB — PSA: PSA: 1.84

## 2021-08-21 NOTE — Progress Notes (Unsigned)
Complete physical exam  I,April Miller,acting as a scribe for Wilhemena Durie, MD.,have documented all relevant documentation on the behalf of Wilhemena Durie, MD,as directed by  Wilhemena Durie, MD while in the presence of Wilhemena Durie, MD.   Patient: Christopher Rose   DOB: 1960/05/27   61 y.o. Male  MRN: [id number] Visit Date: 08/25/2021  Today's healthcare provider: Wilhemena Durie, MD   Chief Complaint  Patient presents with   Annual Exam   Subjective    Christopher Rose is a 61 y.o. male who presents today for a complete physical exam.  He reports consuming a general diet. Home exercise routine includes exercise bike and golf. He generally feels well. He reports sleeping poorly. He does not have additional problems to discuss today.  He continues to work full-time.  He is married and is a father of 2.  He has a granddaughter that he has not been able to see for 2 years.  His daughter is estranged from the rest of the family HPI  Sees Dr. Nehemiah Massed from dermatology  Past Medical History:  Diagnosis Date   Anal fissure    Arthritis    knees   BPH (benign prostatic hyperplasia)    Chronic insomnia    Chronic sinusitis    ED (erectile dysfunction)    HLD (hyperlipidemia)    HTN (hypertension)    Hypogonadism in male    Measles    Mumps    Palpitations    SVT (supraventricular tachycardia) ([place])    Past Surgical History:  Procedure Laterality Date   ANTERIOR CERVICAL DECOMP/DISCECTOMY FUSION N/A 12/06/2017   Procedure: ANTERIOR CERVICAL DECOMPRESSION/DISCECTOMY FUSION 1 LEVEL-C6-7;  Surgeon: Kathlene November, MD;  Location: [facility];  Service: Neurosurgery;  Laterality: N/A;   cardiac ablation surgery  2010   COLONOSCOPY WITH PROPOFOL N/A 12/02/2015   Procedure: COLONOSCOPY WITH PROPOFOL;  Surgeon: Lucilla Lame, MD;  Location: [place];  Service: Endoscopy;  Laterality: N/A;   HERNIA REPAIR  1990   POLYPECTOMY  12/02/2015   Procedure:  POLYPECTOMY;  Surgeon: Lucilla Lame, MD;  Location: [place];  Service: Endoscopy;;   VASECTOMY  1992   Social History   Socioeconomic History   Marital status: Married    Spouse name: Not on file   Number of children: Not on file   Years of education: Not on file   Highest education level: Not on file  Occupational History   Not on file  Tobacco Use   Smoking status: Never   Smokeless tobacco: Never  Vaping Use   Vaping Use: Never used  Substance and Sexual Activity   Alcohol use: Yes    Alcohol/week: 0.0 standard drinks of alcohol    Comment: occasional - holidays   Drug use: No   Sexual activity: Yes  Other Topics Concern   Not on file  Social History Narrative   Not on file   Social Determinants of Health   Financial Resource Strain: Not on file  Food Insecurity: Not on file  Transportation Needs: Not on file  Physical Activity: Not on file  Stress: Not on file  Social Connections: Not on file  Intimate Partner Violence: Not on file   Family Status  Relation Name Status   Mother  (Not Specified)   PGF  (Not Specified)   Father  (Not Specified)   Neg Hx  (Not Specified)   Family History  Problem Relation Age of  Onset   Diabetes Mellitus II Mother    Colon cancer Paternal Grandfather    Heart attack Father    Kidney disease Neg Hx    Prostate cancer Neg Hx    Bladder Cancer Neg Hx    Kidney cancer Neg Hx    Allergies  Allergen Reactions   Amoxicillin Rash   Penicillins Rash and Other (See Comments)    Has patient had a PCN reaction causing immediate rash, facial/tongue/throat swelling, SOB or lightheadedness with hypotension: No Has patient had a PCN reaction causing severe rash involving mucus membranes or skin necrosis: No Has patient had a PCN reaction that required hospitalization: No Has patient had a PCN reaction occurring within the last 10 years: No If all of the above answers are "NO", then may proceed with Cephalosporin use.      Patient Care Team: Jerrol Banana., MD as PCP - General (Family Medicine)   Medications: Outpatient Medications Prior to Visit  Medication Sig   clomiPHENE (CLOMID) 50 MG tablet TAKE 1/2 TABLET BY MOUTH DAILY *NOT COVERED BY INS   lisinopril (ZESTRIL) 20 MG tablet TAKE 1 TABLET BY MOUTH EVERY DAY   meloxicam (MOBIC) 15 MG tablet TAKE 1 TABLET (15 MG TOTAL) BY MOUTH DAILY.   tadalafil (CIALIS) 20 MG tablet Take 1 tablet (20 mg total) by mouth every three (3) days as needed for erectile dysfunction. Every 3 DAYS prn   zolpidem (AMBIEN) 10 MG tablet TAKE 1 TABLET BY MOUTH EVERY DAY   zolpidem (AMBIEN) 10 MG tablet Take 1 tablet (10 mg total) by mouth at bedtime as needed for sleep.   No facility-administered medications prior to visit.    Review of Systems  Constitutional: Negative.   HENT:  Positive for tinnitus.   Eyes: Negative.   Respiratory: Negative.    Cardiovascular: Negative.   Gastrointestinal: Negative.   Endocrine: Negative.   Genitourinary: Negative.   Musculoskeletal: Negative.   Skin: Negative.   Allergic/Immunologic: Negative.   Neurological: Negative.   Hematological: Negative.   Psychiatric/Behavioral: Negative.      Last metabolic panel Lab Results  Component Value Date   GLUCOSE 137 (H) 12/24/2020   NA 141 07/15/2021   K 4.1 07/15/2021   CL 105 07/15/2021   CO2 25 12/24/2020   BUN 19 07/15/2021   CREATININE 1.3 07/15/2021   EGFR 65 07/15/2021   CALCIUM 8.8 07/15/2021   PHOS 3.1 07/15/2021   PROT 6.8 03/14/2021   ALBUMIN 4.8 03/14/2021   LABGLOB 1.9 12/24/2020   AGRATIO 2.4 (H) 12/24/2020   BILITOT 0.4 03/14/2021   ALKPHOS 101 07/15/2021   AST 20 07/15/2021   ALT 24 07/15/2021   ANIONGAP 9 11/30/2017      Objective     BP 126/70 (BP Location: Left Arm, Patient Position: Sitting, Cuff Size: Large)   Pulse 72   Resp 16   Ht $R'5\' 10"'CE$  (1.778 m)   Wt 192 lb (87.1 kg)   SpO2 100%   BMI 27.55 kg/m  BP Readings from Last 3 Encounters:   08/25/21 126/70  06/11/21 132/80  12/24/20 120/86   Wt Readings from Last 3 Encounters:  08/25/21 192 lb (87.1 kg)  06/11/21 193 lb (87.5 kg)  12/24/20 187 lb (84.8 kg)       Physical Exam Constitutional:      Appearance: Normal appearance. He is normal weight.  HENT:     Head: Normocephalic and atraumatic.     Right Ear: Tympanic membrane, ear  canal and external ear normal.     Left Ear: Tympanic membrane, ear canal and external ear normal.     Nose: Nose normal.     Mouth/Throat:     Mouth: Mucous membranes are moist.     Pharynx: Oropharynx is clear.  Eyes:     Extraocular Movements: Extraocular movements intact.     Conjunctiva/sclera: Conjunctivae normal.     Pupils: Pupils are equal, round, and reactive to light.  Cardiovascular:     Rate and Rhythm: Normal rate and regular rhythm.     Pulses: Normal pulses.     Heart sounds: Normal heart sounds.  Pulmonary:     Effort: Pulmonary effort is normal.     Breath sounds: Normal breath sounds.  Abdominal:     General: Abdomen is flat. Bowel sounds are normal.     Palpations: Abdomen is soft.  Genitourinary:    Penis: Normal.      Testes: Normal.  Musculoskeletal:     Cervical back: Normal range of motion and neck supple.  Skin:    General: Skin is warm and dry.  Neurological:     General: No focal deficit present.     Mental Status: He is alert and oriented to person, place, and time. Mental status is at baseline.  Psychiatric:        Mood and Affect: Mood normal.        Behavior: Behavior normal.        Thought Content: Thought content normal.        Judgment: Judgment normal.       Last depression screening scores    08/25/2021    8:28 AM 08/21/2020    9:42 AM 12/08/2018    3:16 PM  PHQ 2/9 Scores  PHQ - 2 Score 0 0 0  PHQ- 9 Score $Remov'4 4 2   'egtQSP$ Last fall risk screening    08/25/2021    8:28 AM  [place] in the past year? 0  Number falls in past yr: 0  Injury with Fall? 0  Risk for fall due  to : No Fall Risks  Follow up Falls evaluation completed   Last Audit-C alcohol use screening    08/25/2021    8:28 AM  Alcohol Use Disorder Test (AUDIT)  1. How often do you have a drink containing alcohol? 2  2. How many drinks containing alcohol do you have on a typical day when you are drinking? 0  3. How often do you have six or more drinks on one occasion? 0  AUDIT-C Score 2   A score of 3 or more in women, and 4 or more in men indicates increased risk for alcohol abuse, EXCEPT if all of the points are from question 1   Results for orders placed or performed in visit on 08/25/21  Iron, TIBC and Ferritin Panel  Result Value Ref Range   Iron 85   CBC and differential  Result Value Ref Range   Hemoglobin 14.7 13.5 - 17.5   HCT 46 41 - 53   Platelets 177 150 - 400 K/uL   WBC 7.4   CBC  Result Value Ref Range   RBC 4.73 3.87 - 3.47  Basic metabolic panel  Result Value Ref Range   Glucose 123    BUN 19 4 - 21   Creatinine 1.3 0.6 - 1.3   Potassium 4.1 3.5 - 5.1 mEq/L   Sodium 141 137 - 147  Chloride 105 99 - 108  Comprehensive metabolic panel  Result Value Ref Range   Globulin 1.9    eGFR 65    Calcium 8.8 8.7 - 10.7  Lipid panel  Result Value Ref Range   Triglycerides 235 (A) 40 - 160   Cholesterol 194 0 - 200   HDL 50 35 - 70   LDL Cholesterol 108   Hepatic function panel  Result Value Ref Range   Alkaline Phosphatase 101 25 - 125   ALT 24 10 - 40 U/L   AST 20 14 - 40   Bilirubin, Direct 0.2 0.01 - 0.4   Bilirubin, Total 0.6   Hemoglobin A1c  Result Value Ref Range   Hemoglobin A1C 5.7   PSA  Result Value Ref Range   PSA 1.84   Lipid panel  Result Value Ref Range   Chol/HDL Ratio 3.9    Non HDL Cholesterol 144    ALBUMIN/GLOBULIN RATIO 2.2    GGT 88    Uric Acid 6.9    MCV 97.3 76.0 - 111.0 fL   MCH 31.1 26.5 - 32.5 pg   MCHC 32.0 (A) 32.5 - 36.9 g/dL   RDW 12.9 11.6 - 14.0 %   Phosphorus 3.1    LDH 140    MPV 10.9 7.8 - 11.0 fL   VLDL  Cholesterol Cal 47     Assessment & Plan    Routine Health Maintenance and Physical Exam  Exercise Activities and Dietary recommendations  Goals   None     Immunization History  Administered Date(s) Administered   Influenza Inj Mdck Quad Pf 10/22/2016   Influenza Inj Mdck Quad With Preservative 10/10/2017   Influenza,inj,Quad PF,6+ Mos 10/03/2015, 10/03/2018, 09/05/2019, 10/10/2020, 08/25/2021   Influenza-Unspecified 10/22/2016, 10/04/2018   PFIZER(Purple Top)SARS-COV-2 Vaccination 03/12/2019, 04/04/2019   Tdap 08/11/2012    Health Maintenance  Topic Date Due   Zoster Vaccines- Shingrix (1 of 2) Never done   COVID-19 Vaccine (3 - Pfizer risk series) 05/02/2019   COLONOSCOPY (Pts 45-91yrs Insurance coverage will need to be confirmed)  12/01/2020   TETANUS/TDAP  08/12/2022   INFLUENZA VACCINE  Completed   Hepatitis C Screening  Completed   HIV Screening  Completed   HPV VACCINES  Aged Out    Discussed health benefits of physical activity, and encouraged him to engage in regular exercise appropriate for his age and condition.  1. Annual physical exam Refer back to GI Dr. Verl Blalock for history of tubular adenomas in 2017.   Follow-up probably 2024 but I will have patient check with GI just to make sure .  2. Need for influenza vaccination  - Flu Vaccine QUAD 33mo+IM (Fluarix, Fluzone & Alfiuria Quad PF)   No follow-ups on file.     I, Wilhemena Durie, MD, have reviewed all documentation for this visit. The documentation on 08/26/21 for the exam, diagnosis, procedures, and orders are all accurate and complete.    Trevon Strothers Cranford Mon, MD  [facility] [phone] (phone) [phone] (fax)  [place]

## 2021-08-25 ENCOUNTER — Encounter: Payer: Self-pay | Admitting: Family Medicine

## 2021-08-25 ENCOUNTER — Ambulatory Visit (INDEPENDENT_AMBULATORY_CARE_PROVIDER_SITE_OTHER): Payer: BC Managed Care – PPO | Admitting: Family Medicine

## 2021-08-25 VITALS — BP 126/70 | HR 72 | Resp 16 | Ht 70.0 in | Wt 192.0 lb

## 2021-08-25 DIAGNOSIS — Z23 Encounter for immunization: Secondary | ICD-10-CM

## 2021-08-25 DIAGNOSIS — Z0000 Encounter for general adult medical examination without abnormal findings: Secondary | ICD-10-CM | POA: Diagnosis not present

## 2021-08-25 DIAGNOSIS — Z125 Encounter for screening for malignant neoplasm of prostate: Secondary | ICD-10-CM

## 2021-09-11 ENCOUNTER — Other Ambulatory Visit: Payer: BC Managed Care – PPO

## 2021-09-11 DIAGNOSIS — N138 Other obstructive and reflux uropathy: Secondary | ICD-10-CM

## 2021-09-11 DIAGNOSIS — E349 Endocrine disorder, unspecified: Secondary | ICD-10-CM

## 2021-09-11 DIAGNOSIS — N401 Enlarged prostate with lower urinary tract symptoms: Secondary | ICD-10-CM | POA: Diagnosis not present

## 2021-09-11 DIAGNOSIS — N529 Male erectile dysfunction, unspecified: Secondary | ICD-10-CM

## 2021-09-12 LAB — PSA: Prostate Specific Ag, Serum: 2.1 ng/mL (ref 0.0–4.0)

## 2021-09-12 LAB — TESTOSTERONE: Testosterone: 889 ng/dL (ref 264–916)

## 2021-09-12 LAB — HEPATIC FUNCTION PANEL
ALT: 25 IU/L (ref 0–44)
AST: 24 IU/L (ref 0–40)
Albumin: 4.5 g/dL (ref 3.9–4.9)
Alkaline Phosphatase: 128 IU/L — ABNORMAL HIGH (ref 44–121)
Bilirubin Total: 0.5 mg/dL (ref 0.0–1.2)
Bilirubin, Direct: 0.2 mg/dL (ref 0.00–0.40)
Total Protein: 6.6 g/dL (ref 6.0–8.5)

## 2021-09-12 LAB — HEMOGLOBIN AND HEMATOCRIT, BLOOD
Hematocrit: 42.1 % (ref 37.5–51.0)
Hemoglobin: 14.4 g/dL (ref 13.0–17.7)

## 2021-09-15 NOTE — Progress Notes (Unsigned)
02/22/2018 8:14 AM   [place] [birth year] [id number]  Referring provider: Jerrol Banana., MD [address] [place],  [place] [address]  Urological history: 1.  Testosterone deficiency -contributing factors of age, history of mumps and prediabetes -Testosterone (09/2021) 889 -Hemoglobin/hematocrit (09/2021) 14.4/42.1 -Hepatic panel (09/2021) stable -Clomid 50 mg, 1/2 tablet daily  2. BPH with LU TS -I PSS ***  3. ED -Contributing factors of age, BPH, testosterone deficiency and HTN -SHIM *** -Cialis 20 mg, 1/2 tablet   4. Prostate cancer screening -PSA (09/2021) 2.1 -AUA guidelines recommend prostate cancer screening in men ages 78-69 every two to four years, he is being screened twice yearly due to history of elevated PSA and on TRT  -no family history of prostate cancer, ovarian cancer or breast cancer ***  5. Undesired fertility -vasectomy ~ 30 years ago  No chief complaint on file.   HPI: Christopher Rose is a 61 y.o. male who presents today for a one year follow up.      Score: 1-7 Severe ED 8-11 Moderate ED 12-16 Mild-Moderate ED 17-21 Mild ED 22-25 No ED     Score:  1-7 Mild 8-19 Moderate 20-35 Severe  PMH: Past Medical History:  Diagnosis Date   Anal fissure    Arthritis    knees   BPH (benign prostatic hyperplasia)    Chronic insomnia    Chronic sinusitis    ED (erectile dysfunction)    HLD (hyperlipidemia)    HTN (hypertension)    Hypogonadism in male    Measles    Mumps    Palpitations    SVT (supraventricular tachycardia) ([employer])     Surgical History: Past Surgical History:  Procedure Laterality Date   ANTERIOR CERVICAL DECOMP/DISCECTOMY FUSION N/A 12/06/2017   Procedure: ANTERIOR CERVICAL DECOMPRESSION/DISCECTOMY FUSION 1 LEVEL-C6-7;  Surgeon: Kathlene November, MD;  Location: [facility];  Service: Neurosurgery;  Laterality: N/A;   cardiac ablation surgery  2010   COLONOSCOPY WITH PROPOFOL N/A 12/02/2015    Procedure: COLONOSCOPY WITH PROPOFOL;  Surgeon: Lucilla Lame, MD;  Location: [facility];  Service: Endoscopy;  Laterality: N/A;   HERNIA REPAIR  1990   POLYPECTOMY  12/02/2015   Procedure: POLYPECTOMY;  Surgeon: Lucilla Lame, MD;  Location: [facility];  Service: Endoscopy;;   VASECTOMY  1992    Home Medications:  Allergies as of 09/16/2021       Reactions   Amoxicillin Rash   Penicillins Rash, Other (See Comments)   Has patient had a PCN reaction causing immediate rash, facial/tongue/throat swelling, SOB or lightheadedness with hypotension: No Has patient had a PCN reaction causing severe rash involving mucus membranes or skin necrosis: No Has patient had a PCN reaction that required hospitalization: No Has patient had a PCN reaction occurring within the last 10 years: No If all of the above answers are "NO", then may proceed with Cephalosporin use.        Medication List        Accurate as of September 15, 2021  8:14 AM. If you have any questions, ask your nurse or doctor.          Clomid 50 MG tablet Generic drug: clomiPHENE TAKE 1/2 TABLET BY MOUTH DAILY *NOT COVERED BY INS   lisinopril 20 MG tablet Commonly known as: ZESTRIL TAKE 1 TABLET BY MOUTH EVERY DAY   meloxicam 15 MG tablet Commonly known as: MOBIC TAKE 1 TABLET (15 MG TOTAL) BY MOUTH DAILY.  tadalafil 20 MG tablet Commonly known as: Cialis Take 1 tablet (20 mg total) by mouth every three (3) days as needed for erectile dysfunction. Every 3 DAYS prn   zolpidem 10 MG tablet Commonly known as: AMBIEN TAKE 1 TABLET BY MOUTH EVERY DAY   zolpidem 10 MG tablet Commonly known as: AMBIEN Take 1 tablet (10 mg total) by mouth at bedtime as needed for sleep.        Allergies:  Allergies  Allergen Reactions   Amoxicillin Rash   Penicillins Rash and Other (See Comments)    Has patient had a PCN reaction causing immediate rash, facial/tongue/throat swelling, SOB or lightheadedness with  hypotension: No Has patient had a PCN reaction causing severe rash involving mucus membranes or skin necrosis: No Has patient had a PCN reaction that required hospitalization: No Has patient had a PCN reaction occurring within the last 10 years: No If all of the above answers are "NO", then may proceed with Cephalosporin use.     Family History: Family History  Problem Relation Age of Onset   Diabetes Mellitus II Mother    Colon cancer Paternal Grandfather    Heart attack Father    Kidney disease Neg Hx    Prostate cancer Neg Hx    Bladder Cancer Neg Hx    Kidney cancer Neg Hx     Social History:  reports that he has never smoked. He has never used smokeless tobacco. He reports current alcohol use. He reports that he does not use drugs.  ROS: Pertinent review of systems can be found in history of present illness  Physical Exam: There were no vitals taken for this visit.  Constitutional:  Well nourished. Alert and oriented, No acute distress. HEENT: [place] AT, moist mucus membranes.  Trachea midline Cardiovascular: No clubbing, cyanosis, or edema. Respiratory: Normal respiratory effort, no increased work of breathing. GU: No CVA tenderness.  No bladder fullness or masses.  Patient with circumcised/uncircumcised phallus. ***Foreskin easily retracted***  Urethral meatus is patent.  No penile discharge. No penile lesions or rashes. Scrotum without lesions, cysts, rashes and/or edema.  Testicles are located scrotally bilaterally. No masses are appreciated in the testicles. Left and right epididymis are normal. Rectal: Patient with  normal sphincter tone. Anus and perineum without scarring or rashes. No rectal masses are appreciated. Prostate is approximately *** grams, *** nodules are appreciated. Seminal vesicles are normal. Neurologic: Grossly intact, no focal deficits, moving all 4 extremities. Psychiatric: Normal mood and affect.   Laboratory Data: Results for orders placed or  performed in visit on 09/11/21  Hepatic function panel  Result Value Ref Range   Total Protein 6.6 6.0 - 8.5 g/dL   Albumin 4.5 3.9 - 4.9 g/dL   Bilirubin Total 0.5 0.0 - 1.2 mg/dL   Bilirubin, Direct 0.20 0.00 - 0.40 mg/dL   Alkaline Phosphatase 128 (H) 44 - 121 IU/L   AST 24 0 - 40 IU/L   ALT 25 0 - 44 IU/L  Testosterone  Result Value Ref Range   Testosterone 889 264 - 916 ng/dL  Hemoglobin and hematocrit, blood  Result Value Ref Range   Hemoglobin 14.4 13.0 - 17.7 g/dL   Hematocrit 42.1 37.5 - 51.0 %  PSA  Result Value Ref Range   Prostate Specific Ag, Serum 2.1 0.0 - 4.0 ng/mL      Latest Ref Rng & Units 09/11/2021    9:25 AM 07/15/2021   12:00 AM 03/14/2021    9:35 AM  CMP  BUN 4 - 21  19       Creatinine 0.6 - 1.3  1.3       Sodium 137 - 147  141       Potassium 3.5 - 5.1 mEq/L  4.1       Chloride 99 - 108  105       Calcium 8.7 - 10.7  8.8       Total Protein 6.0 - 8.5 g/dL 6.6   6.8   Total Bilirubin 0.0 - 1.2 mg/dL 0.5   0.4   Alkaline Phos 44 - 121 IU/L 128  101     125   AST 0 - 40 IU/L '24  20     22   '$ ALT 0 - 44 IU/L '25  24     29      '$ This result is from an external source.       Latest Ref Rng & Units 09/11/2021    9:25 AM 07/15/2021   12:00 AM 03/14/2021    9:35 AM  CBC  WBC   7.4       Hemoglobin 13.0 - 17.7 g/dL 14.4  14.7     15.0   Hematocrit 37.5 - 51.0 % 42.1  46     44.2   Platelets 150 - 400 K/uL  177          This result is from an external source.     Lab Results  Component Value Date   AST 24 09/11/2021   Lab Results  Component Value Date   ALT 25 09/11/2021  I reviewed the labs  Assessment & Plan:    1. Testosterone deficiency  -testosterone levels are therapeutic -H & H WNL -LFT's stable -continue Clomid 50 mg, 1/2 tablet daily  2. BPH with LUTS -PSA stable -DRE benign -UA benign -PVR < 300 cc -symptoms - *** -most bothersome symptoms are *** -continue conservative management, avoiding bladder irritants and timed  voiding's -Initiate alpha-blocker (***), discussed side effects *** -Initiate 5 alpha reductase inhibitor (***), discussed side effects *** -Continue tamsulosin 0.4 mg daily, alfuzosin 10 mg daily, Rapaflo 8 mg daily, terazosin, doxazosin, Cialis 5 mg daily and finasteride 5 mg daily, dutasteride 0.5 mg daily***:refills given -Cannot tolerate medication or medication failure, schedule cystoscopy ***  3. Erectile dysfunction:    - I explained that conditions like diabetes, hypertension, coronary artery disease, peripheral vascular disease, smoking, alcohol consumption, age, sleep apnea and BPH can diminish the ability to have an erection - I explained the ED may be a risk marker for underlying CVD and he should follow up with PCP for further studies *** - A recent study published in Sex Med 2018 Apr 13 revealed moderate to vigorous aerobic exercise for 40 minutes 4 times per week can decrease erectile problems caused by physical inactivity, obesity, hypertension, metabolic syndrome and/or cardiovascular diseases *** - We discussed trying a *** different PDE5 inhibitor, intra-urethral suppositories, intracavernous vasoactive drug injection therapy, vacuum erection devices and penile prosthesis implantation     No follow-ups on file.  Micheil Klaus, [place] [address] [place], [place] [address] [phone]

## 2021-09-16 ENCOUNTER — Ambulatory Visit: Payer: BC Managed Care – PPO | Admitting: Urology

## 2021-09-16 ENCOUNTER — Encounter: Payer: Self-pay | Admitting: Urology

## 2021-09-16 VITALS — BP 129/83 | HR 81 | Ht 65.0 in | Wt 192.0 lb

## 2021-09-16 DIAGNOSIS — E291 Testicular hypofunction: Secondary | ICD-10-CM | POA: Diagnosis not present

## 2021-09-16 DIAGNOSIS — N138 Other obstructive and reflux uropathy: Secondary | ICD-10-CM

## 2021-09-16 DIAGNOSIS — N401 Enlarged prostate with lower urinary tract symptoms: Secondary | ICD-10-CM

## 2021-09-16 DIAGNOSIS — N529 Male erectile dysfunction, unspecified: Secondary | ICD-10-CM

## 2021-09-16 DIAGNOSIS — E349 Endocrine disorder, unspecified: Secondary | ICD-10-CM

## 2021-09-17 ENCOUNTER — Other Ambulatory Visit: Payer: Self-pay

## 2021-09-17 DIAGNOSIS — E349 Endocrine disorder, unspecified: Secondary | ICD-10-CM

## 2021-09-17 DIAGNOSIS — N138 Other obstructive and reflux uropathy: Secondary | ICD-10-CM

## 2021-09-17 DIAGNOSIS — N401 Enlarged prostate with lower urinary tract symptoms: Secondary | ICD-10-CM

## 2021-09-18 ENCOUNTER — Other Ambulatory Visit: Payer: Self-pay | Admitting: Family Medicine

## 2021-10-07 ENCOUNTER — Telehealth: Payer: Self-pay

## 2021-10-07 NOTE — Patient Outreach (Signed)
  Care Coordination   10/07/2021 Name: Christopher Rose MRN: [id number] DOB: [birth year]   Care Coordination Outreach Attempts:  An unsuccessful telephone outreach was attempted today to offer the patient information about available care coordination services as a benefit of their health plan.   Follow Up Plan:  Additional outreach attempts will be made to offer the patient care coordination information and services.   Encounter Outcome:  No Answer  Care Coordination Interventions Activated:  No   Care Coordination Interventions:  No, not indicated    Noreene Larsson RN, MSN, [facility]  Mobile: [phone]

## 2021-10-14 ENCOUNTER — Other Ambulatory Visit: Payer: Self-pay | Admitting: Urology

## 2021-10-14 DIAGNOSIS — E349 Endocrine disorder, unspecified: Secondary | ICD-10-CM

## 2021-12-15 ENCOUNTER — Other Ambulatory Visit: Payer: Self-pay | Admitting: Physician Assistant

## 2021-12-22 ENCOUNTER — Encounter: Payer: Self-pay | Admitting: Physician Assistant

## 2021-12-22 ENCOUNTER — Ambulatory Visit: Payer: BC Managed Care – PPO | Admitting: Physician Assistant

## 2021-12-22 VITALS — BP 126/82 | HR 82 | Wt 189.3 lb

## 2021-12-22 DIAGNOSIS — G4700 Insomnia, unspecified: Secondary | ICD-10-CM

## 2021-12-22 DIAGNOSIS — I10 Essential (primary) hypertension: Secondary | ICD-10-CM | POA: Diagnosis not present

## 2021-12-22 DIAGNOSIS — E7800 Pure hypercholesterolemia, unspecified: Secondary | ICD-10-CM

## 2021-12-22 MED ORDER — ZOLPIDEM TARTRATE 10 MG PO TABS: 10.0000 mg | ORAL_TABLET | Freq: Every evening | ORAL | 0 refills | Status: DC | PRN

## 2021-12-22 NOTE — Progress Notes (Signed)
I,Sha'taria Tyson,acting as a Education administrator for [employer], PA-C.,have documented all relevant documentation on the behalf of Mardene Speak, PA-C,as directed by  [employer], PA-C while in the presence of [employer], PA-C.   Established patient visit   Patient: Christopher Rose   DOB: 1960/11/13   61 y.o. Male  MRN: [id number] Visit Date: 12/22/2021  Today's healthcare provider: Mardene Speak, PA-C   CC: FU for his chronic conditions-HTN, elevated cholesterol and insomnia  Subjective    HPI  Colonscopy: pt believes that there were an error in care gaps as he was told by GI that he needs to repeat his colonoscopy in 10 years not 5 years.  Hypertension, follow-up  BP Readings from Last 3 Encounters:  09/16/21 129/83  08/25/21 126/70  06/11/21 132/80   Wt Readings from Last 3 Encounters:  09/16/21 192 lb (87.1 kg)  08/25/21 192 lb (87.1 kg)  06/11/21 193 lb (87.5 kg)     He was last seen for hypertension 6 months ago.  BP at that visit was 132/80. Management since that visit includes continue current treatment.  Use of agents associated with hypertension: none.   Outside blood pressures are not being checked.  Follow up for insomnia  The patient was last seen for this 6 months ago. Changes made at last visit include to start to cut back on Ambien as he has till retirement.  He reports excellent compliance with treatment. He feels that condition is Improved. He is not having side effects.    ---------------------------------------------------------------------------------------------------   Medications: Outpatient Medications Prior to Visit  Medication Sig   CLOMID 50 MG tablet TAKE 1/2 TABLET BY MOUTH DAILY *NOT COVERED BY INS   lisinopril (ZESTRIL) 20 MG tablet TAKE 1 TABLET BY MOUTH EVERY DAY   meloxicam (MOBIC) 15 MG tablet TAKE 1 TABLET (15 MG TOTAL) BY MOUTH DAILY.   tadalafil (CIALIS) 20 MG tablet Take 1 tablet (20 mg total) by mouth every three (3) days  as needed for erectile dysfunction. Every 3 DAYS prn   zolpidem (AMBIEN) 10 MG tablet TAKE 1 TABLET BY MOUTH EVERY DAY   zolpidem (AMBIEN) 10 MG tablet Take 1 tablet (10 mg total) by mouth at bedtime as needed for sleep.   No facility-administered medications prior to visit.    Review of Systems  All other systems reviewed and are negative.  Except see HPI Last CBC Lab Results  Component Value Date   WBC 7.4 07/15/2021   HGB 14.4 09/11/2021   HCT 42.1 09/11/2021   MCV 97.9 07/18/2019   MCH 31.0 07/18/2019   RDW 13.0 07/18/2019   PLT 177 07/15/2021   Last lipids Lab Results  Component Value Date   CHOL 194 07/15/2021   HDL 50 07/15/2021   LDLCALC 108 07/15/2021   TRIG 235 (A) 07/15/2021   CHOLHDL 3.9 07/15/2021       Objective    There were no vitals taken for this visit.   Physical Exam Vitals reviewed.  Constitutional:      General: He is not in acute distress.    Appearance: Normal appearance. He is not diaphoretic.  HENT:     Head: Normocephalic and atraumatic.  Eyes:     General: No scleral icterus.    Conjunctiva/sclera: Conjunctivae normal.  Cardiovascular:     Rate and Rhythm: Normal rate and regular rhythm.     Pulses: Normal pulses.     Heart sounds: Normal heart sounds. No murmur heard. Pulmonary:  Effort: Pulmonary effort is normal. No respiratory distress.     Breath sounds: Normal breath sounds. No wheezing or rhonchi.  Musculoskeletal:     Cervical back: Neck supple.     Right lower leg: No edema.     Left lower leg: No edema.  Lymphadenopathy:     Cervical: No cervical adenopathy.  Skin:    General: Skin is warm and dry.     Findings: No rash.  Neurological:     Mental Status: He is alert and oriented to person, place, and time. Mental status is at baseline.  Psychiatric:        Mood and Affect: Mood normal.        Behavior: Behavior normal.      No results found for any visits on 12/22/21.  Assessment & Plan    Primary  hypertension Chonic and stable BP today wnl Continue lisinopril '20mg'$  daily Encouraged low-salt diet and daily exercise Will Fu with Dr. Rosanna Randy next year  Insomnia, persistent Chronic and stable Continue taking ambien 10 mg at bedtime Pt has been on this medication since 2021 A courtesy refill before pt was able to see his PCP next year. - zolpidem (AMBIEN) 10 MG tablet; Take 1 tablet (10 mg total) by mouth at bedtime as needed for sleep.  Dispense: 90 tablet; Refill: 0 Will Fu with his PCP  Elevated cholesterol Chronic and stable Not on medication Last lipid levels from 07/2021 WNL Will FU in 3-6 mo   Pt prefers not do any labs as the last labs were done in September The patient was advised to call back or seek an in-person evaluation if the symptoms worsen or if the condition fails to improve as anticipated.  I discussed the assessment and treatment plan with the patient. The patient was provided an opportunity to ask questions and all were answered. The patient agreed with the plan and demonstrated an understanding of the instructions.  The entirety of the information documented in the History of Present Illness, Review of Systems and Physical Exam were personally obtained by me. Portions of this information were initially documented by the CMA and reviewed by me for thoroughness and accuracy.  Mardene Speak, [place], [place] [phone] (phone) [phone] (fax)

## 2022-01-16 ENCOUNTER — Other Ambulatory Visit: Payer: Self-pay | Admitting: Physician Assistant

## 2022-01-16 ENCOUNTER — Other Ambulatory Visit: Payer: Self-pay | Admitting: Urology

## 2022-01-16 DIAGNOSIS — E349 Endocrine disorder, unspecified: Secondary | ICD-10-CM

## 2022-01-19 NOTE — Telephone Encounter (Signed)
Requested medications are due for refill today.  no  Requested medications are on the active medications list.  yes  Last refill. 12/15/2021 #90 0 rf  Future visit scheduled.   no  Notes to clinic.  Dr. Rosanna Randy pt. Request for refill too soon.    Requested Prescriptions  Pending Prescriptions Disp Refills   meloxicam (MOBIC) 15 MG tablet [Pharmacy Med Name: MELOXICAM 15 MG TABLET] 90 tablet 0    Sig: TAKE 1 TABLET (15 MG TOTAL) BY MOUTH DAILY.     Analgesics:  COX2 Inhibitors Failed - 01/16/2022  7:07 PM      Failed - Manual Review: Labs are only required if the patient has taken medication for more than 8 weeks.      Passed - HGB in normal range and within 360 days    Hemoglobin  Date Value Ref Range Status  09/11/2021 14.4 13.0 - 17.7 g/dL Final         Passed - Cr in normal range and within 360 days    Creatinine  Date Value Ref Range Status  07/15/2021 1.3 0.6 - 1.3 Final   Creatinine, Ser  Date Value Ref Range Status  12/24/2020 1.28 (H) 0.76 - 1.27 mg/dL Final         Passed - HCT in normal range and within 360 days    Hematocrit  Date Value Ref Range Status  09/11/2021 42.1 37.5 - 51.0 % Final         Passed - AST in normal range and within 360 days    AST  Date Value Ref Range Status  09/11/2021 24 0 - 40 IU/L Final         Passed - ALT in normal range and within 360 days    ALT  Date Value Ref Range Status  09/11/2021 25 0 - 44 IU/L Final         Passed - eGFR is 30 or above and within 360 days    GFR calc Af Amer  Date Value Ref Range Status  11/15/2019 74 >59 mL/min/1.73 Final    Comment:    **In accordance with recommendations from the NKF-ASN Task force,**   [facility] is in the process of updating its eGFR calculation to the   2021 CKD-EPI creatinine equation that estimates kidney function   without a race variable.    GFR calc non Af Amer  Date Value Ref Range Status  11/15/2019 64 >59 mL/min/1.73 Final   eGFR  Date Value Ref Range  Status  07/15/2021 65  Final  12/24/2020 64 >59 mL/min/1.73 Final         Passed - Patient is not pregnant      Passed - Valid encounter within last 12 months    Recent Outpatient Visits           4 weeks ago Primary hypertension   [employer], [place], PA-C   4 months ago Annual physical exam   [facility] Jerrol Banana., MD   7 months ago Primary hypertension   [facility] Jerrol Banana., MD   1 year ago Elevated liver function tests   [facility] Gwyneth Sprout, FNP   1 year ago Annual physical exam   [facility] Jerrol Banana., MD

## 2022-01-21 NOTE — Telephone Encounter (Signed)
LVMTCB. CRM creted. [place] for [facility] to advise MyChart letter also sent advigin OV needed for refill

## 2022-01-21 NOTE — Telephone Encounter (Signed)
Copied from [place] [id number]. Topic: Conservator, museum/gallery Patient (Clinic Use ONLY) >> Jan 21, 2022  1:21 PM Sha'Taria T wrote: Reason for CRM: See medication refill encounter from 01/16/22. OK for [facility] to advise >> Jan 21, 2022  1:26 PM Ja-Kwan M wrote: Pt returned call to the office. Pt does not agree with scheduling an appt as he stated he was just in the office last month for med refill.

## 2022-03-13 ENCOUNTER — Other Ambulatory Visit: Payer: Self-pay | Admitting: Physician Assistant

## 2022-03-16 NOTE — Telephone Encounter (Signed)
Requested Prescriptions  Pending Prescriptions Disp Refills   meloxicam (MOBIC) 15 MG tablet [Pharmacy Med Name: MELOXICAM 15 MG TABLET] 90 tablet 0    Sig: TAKE 1 TABLET (15 MG TOTAL) BY MOUTH DAILY.     Analgesics:  COX2 Inhibitors Failed - 03/13/2022  3:56 PM      Failed - Manual Review: Labs are only required if the patient has taken medication for more than 8 weeks.      Passed - HGB in normal range and within 360 days    Hemoglobin  Date Value Ref Range Status  09/11/2021 14.4 13.0 - 17.7 g/dL Final         Passed - Cr in normal range and within 360 days    Creatinine  Date Value Ref Range Status  07/15/2021 1.3 0.6 - 1.3 Final   Creatinine, Ser  Date Value Ref Range Status  12/24/2020 1.28 (H) 0.76 - 1.27 mg/dL Final         Passed - HCT in normal range and within 360 days    Hematocrit  Date Value Ref Range Status  09/11/2021 42.1 37.5 - 51.0 % Final         Passed - AST in normal range and within 360 days    AST  Date Value Ref Range Status  09/11/2021 24 0 - 40 IU/L Final         Passed - ALT in normal range and within 360 days    ALT  Date Value Ref Range Status  09/11/2021 25 0 - 44 IU/L Final         Passed - eGFR is 30 or above and within 360 days    GFR calc Af Amer  Date Value Ref Range Status  11/15/2019 74 >59 mL/min/1.73 Final    Comment:    **In accordance with recommendations from the NKF-ASN Task force,**   [facility] is in the process of updating its eGFR calculation to the   2021 CKD-EPI creatinine equation that estimates kidney function   without a race variable.    GFR calc non Af Amer  Date Value Ref Range Status  11/15/2019 64 >59 mL/min/1.73 Final   eGFR  Date Value Ref Range Status  07/15/2021 65  Final  12/24/2020 64 >59 mL/min/1.73 Final         Passed - Patient is not pregnant      Passed - Valid encounter within last 12 months    Recent Outpatient Visits           2 months ago Primary hypertension   [facility], [place], PA-C   6 months ago Annual physical exam   [facility] Eulas Post, MD   9 months ago Primary hypertension   [place] Eulas Post, MD   1 year ago Elevated liver function tests   [facility] Gwyneth Sprout, FNP   1 year ago Annual physical exam   [facility] Eulas Post, MD

## 2022-03-17 ENCOUNTER — Other Ambulatory Visit: Payer: Self-pay | Admitting: Urology

## 2022-03-17 ENCOUNTER — Other Ambulatory Visit: Payer: BC Managed Care – PPO

## 2022-03-17 DIAGNOSIS — E349 Endocrine disorder, unspecified: Secondary | ICD-10-CM

## 2022-03-17 DIAGNOSIS — N401 Enlarged prostate with lower urinary tract symptoms: Secondary | ICD-10-CM

## 2022-03-17 DIAGNOSIS — N138 Other obstructive and reflux uropathy: Secondary | ICD-10-CM | POA: Diagnosis not present

## 2022-03-17 MED ORDER — CLOMID 50 MG PO TABS: ORAL_TABLET | ORAL | 0 refills | Status: DC

## 2022-03-17 NOTE — Telephone Encounter (Signed)
Patient in office this morning for labs, and is requesting refill for Clomid 50 mg. Pharmacy [facility] on [address]

## 2022-03-18 ENCOUNTER — Telehealth: Payer: Self-pay | Admitting: Family Medicine

## 2022-03-18 LAB — HEMOGLOBIN AND HEMATOCRIT, BLOOD
Hematocrit: 42.8 % (ref 37.5–51.0)
Hemoglobin: 14.6 g/dL (ref 13.0–17.7)

## 2022-03-18 LAB — HEPATIC FUNCTION PANEL
ALT: 29 IU/L (ref 0–44)
AST: 23 IU/L (ref 0–40)
Albumin: 4.4 g/dL (ref 3.9–4.9)
Alkaline Phosphatase: 112 IU/L (ref 44–121)
Bilirubin Total: 0.6 mg/dL (ref 0.0–1.2)
Bilirubin, Direct: 0.25 mg/dL (ref 0.00–0.40)
Total Protein: 6.2 g/dL (ref 6.0–8.5)

## 2022-03-18 LAB — TESTOSTERONE: Testosterone: 965 ng/dL — ABNORMAL HIGH (ref 264–916)

## 2022-03-18 LAB — PSA: Prostate Specific Ag, Serum: 2.5 ng/mL (ref 0.0–4.0)

## 2022-03-18 NOTE — Telephone Encounter (Signed)
-----   Message from Nori Riis, PA-C sent at 03/18/2022  7:22 AM EDT ----- Please let Mr. Cousar know that his labs look good.  I will need to see him in September for repeat labs (am testosterone, H&H, PSA and LFT's), I PSS, SHIM and exam.

## 2022-03-18 NOTE — Telephone Encounter (Signed)
Patient notified and voiced understanding. Appointments have been made. 

## 2022-04-30 DIAGNOSIS — R7303 Prediabetes: Secondary | ICD-10-CM | POA: Diagnosis not present

## 2022-04-30 DIAGNOSIS — E7800 Pure hypercholesterolemia, unspecified: Secondary | ICD-10-CM | POA: Diagnosis not present

## 2022-04-30 DIAGNOSIS — I10 Essential (primary) hypertension: Secondary | ICD-10-CM | POA: Diagnosis not present

## 2022-04-30 DIAGNOSIS — G4700 Insomnia, unspecified: Secondary | ICD-10-CM | POA: Diagnosis not present

## 2022-08-02 ENCOUNTER — Other Ambulatory Visit: Payer: Self-pay | Admitting: Physician Assistant

## 2022-08-04 NOTE — Telephone Encounter (Signed)
Unable to refill per protocol, PCP no longer at this practice.  Requested Prescriptions  Pending Prescriptions Disp Refills   meloxicam (MOBIC) 15 MG tablet [Pharmacy Med Name: MELOXICAM 15 MG TABLET] 90 tablet 0    Sig: TAKE 1 TABLET (15 MG TOTAL) BY MOUTH DAILY.     Analgesics:  COX2 Inhibitors Failed - 08/02/2022  1:29 PM      Failed - Manual Review: Labs are only required if the patient has taken medication for more than 8 weeks.      Failed - Cr in normal range and within 360 days    Creatinine  Date Value Ref Range Status  07/15/2021 1.3 0.6 - 1.3 Final   Creatinine, Ser  Date Value Ref Range Status  12/24/2020 1.28 (H) 0.76 - 1.27 mg/dL Final         Failed - eGFR is 30 or above and within 360 days    GFR calc Af Amer  Date Value Ref Range Status  11/15/2019 74 >59 mL/min/1.73 Final    Comment:    **In accordance with recommendations from the NKF-ASN Task force,**   [facility] is in the process of updating its eGFR calculation to the   2021 CKD-EPI creatinine equation that estimates kidney function   without a race variable.    GFR calc non Af Amer  Date Value Ref Range Status  11/15/2019 64 >59 mL/min/1.73 Final   eGFR  Date Value Ref Range Status  07/15/2021 65  Final  12/24/2020 64 >59 mL/min/1.73 Final         Passed - HGB in normal range and within 360 days    Hemoglobin  Date Value Ref Range Status  03/17/2022 14.6 13.0 - 17.7 g/dL Final         Passed - HCT in normal range and within 360 days    Hematocrit  Date Value Ref Range Status  03/17/2022 42.8 37.5 - 51.0 % Final         Passed - AST in normal range and within 360 days    AST  Date Value Ref Range Status  03/17/2022 23 0 - 40 IU/L Final         Passed - ALT in normal range and within 360 days    ALT  Date Value Ref Range Status  03/17/2022 29 0 - 44 IU/L Final         Passed - Patient is not pregnant      Passed - Valid encounter within last 12 months    Recent Outpatient Visits            7 months ago Primary hypertension   [facility] [place], [place], PA-C   11 months ago Annual physical exam   [facility] Bosie Clos, MD   1 year ago Primary hypertension   [facility] Bosie Clos, MD   1 year ago Elevated liver function tests   [facility] Jacky Kindle, FNP   1 year ago Annual physical exam   [facility] Bosie Clos, MD       Future Appointments             In 1 month [place], Elana [place] [facility] [place]

## 2022-09-07 ENCOUNTER — Other Ambulatory Visit: Payer: Self-pay | Admitting: Physician Assistant

## 2022-09-08 ENCOUNTER — Other Ambulatory Visit: Payer: Self-pay | Admitting: *Deleted

## 2022-09-08 DIAGNOSIS — E349 Endocrine disorder, unspecified: Secondary | ICD-10-CM

## 2022-09-08 DIAGNOSIS — R972 Elevated prostate specific antigen [PSA]: Secondary | ICD-10-CM

## 2022-09-09 NOTE — Telephone Encounter (Signed)
Requested medication (s) are due for refill today: Yes  Requested medication (s) are on the active medication list: Yes  Last refill:  03/16/22  Future visit scheduled: No  Notes to clinic:  Unable to refill per protocol due to failed labs, no updated results.      Requested Prescriptions  Pending Prescriptions Disp Refills   meloxicam (MOBIC) 15 MG tablet [Pharmacy Med Name: MELOXICAM 15 MG TABLET] 90 tablet 0    Sig: TAKE 1 TABLET (15 MG TOTAL) BY MOUTH DAILY.     Analgesics:  COX2 Inhibitors Failed - 09/07/2022  5:58 PM      Failed - Manual Review: Labs are only required if the patient has taken medication for more than 8 weeks.      Failed - Cr in normal range and within 360 days    Creatinine  Date Value Ref Range Status  07/15/2021 1.3 0.6 - 1.3 Final   Creatinine, Ser  Date Value Ref Range Status  12/24/2020 1.28 (H) 0.76 - 1.27 mg/dL Final         Failed - eGFR is 30 or above and within 360 days    GFR calc Af Amer  Date Value Ref Range Status  11/15/2019 74 >59 mL/min/1.73 Final    Comment:    **In accordance with recommendations from the NKF-ASN Task force,**   [facility] is in the process of updating its eGFR calculation to the   2021 CKD-EPI creatinine equation that estimates kidney function   without a race variable.    GFR calc non Af Amer  Date Value Ref Range Status  11/15/2019 64 >59 mL/min/1.73 Final   eGFR  Date Value Ref Range Status  07/15/2021 65  Final  12/24/2020 64 >59 mL/min/1.73 Final         Passed - HGB in normal range and within 360 days    Hemoglobin  Date Value Ref Range Status  03/17/2022 14.6 13.0 - 17.7 g/dL Final         Passed - HCT in normal range and within 360 days    Hematocrit  Date Value Ref Range Status  03/17/2022 42.8 37.5 - 51.0 % Final         Passed - AST in normal range and within 360 days    AST  Date Value Ref Range Status  03/17/2022 23 0 - 40 IU/L Final         Passed - ALT in normal range and within  360 days    ALT  Date Value Ref Range Status  03/17/2022 29 0 - 44 IU/L Final         Passed - Patient is not pregnant      Passed - Valid encounter within last 12 months    Recent Outpatient Visits           8 months ago Primary hypertension   [place] [facility] [place], [place], PA-C   1 year ago Annual physical exam   [facility] Bosie Clos, MD   1 year ago Primary hypertension   [place] [facility] Bosie Clos, MD   1 year ago Elevated liver function tests   [facility] Merita Norton T, FNP   2 years ago Annual physical exam   [facility] Bosie Clos, MD       Future Appointments  In 1 week [place], Elana Alm [facility]

## 2022-09-16 ENCOUNTER — Other Ambulatory Visit: Payer: BC Managed Care – PPO

## 2022-09-16 DIAGNOSIS — E349 Endocrine disorder, unspecified: Secondary | ICD-10-CM

## 2022-09-16 DIAGNOSIS — R972 Elevated prostate specific antigen [PSA]: Secondary | ICD-10-CM

## 2022-09-17 LAB — HEPATIC FUNCTION PANEL
ALT: 32 IU/L (ref 0–44)
AST: 25 IU/L (ref 0–40)
Albumin: 4.4 g/dL (ref 3.9–4.9)
Alkaline Phosphatase: 122 IU/L — ABNORMAL HIGH (ref 44–121)
Bilirubin Total: 0.5 mg/dL (ref 0.0–1.2)
Bilirubin, Direct: 0.26 mg/dL (ref 0.00–0.40)
Total Protein: 6.5 g/dL (ref 6.0–8.5)

## 2022-09-17 LAB — TESTOSTERONE: Testosterone: 933 ng/dL — ABNORMAL HIGH (ref 264–916)

## 2022-09-17 LAB — HEMOGLOBIN AND HEMATOCRIT, BLOOD
Hematocrit: 43.7 % (ref 37.5–51.0)
Hemoglobin: 14.6 g/dL (ref 13.0–17.7)

## 2022-09-17 LAB — PSA: Prostate Specific Ag, Serum: 2.4 ng/mL (ref 0.0–4.0)

## 2022-09-21 NOTE — Progress Notes (Unsigned)
02/22/2018 9:53 AM   Christopher Rose 1960-08-18 [id number]  Referring provider: Bosie Clos, MD [address] [place],  [place] [address]  Urological history: 1.  Testosterone deficiency -contributing factors of age, history of mumps and prediabetes -Testosterone (09/2022) 933 -Hemoglobin/hematocrit (09/2022) 14.6/43.7 -Hepatic panel (09/2022) stable -Clomid 50 mg, 1/2 tablet daily  2. BPH with LU TS  3. ED -Contributing factors of age, BPH, testosterone deficiency and HTN -Cialis 20 mg, 1/2 tablet   4. Prostate cancer screening -PSA (09/2022) 2.4 -AUA guidelines recommend prostate cancer screening in men ages 49-69 every two to four years, he is being screened twice yearly due to history of elevated PSA and on TRT  -no family history of prostate cancer, ovarian cancer or breast cancer   5. Undesired fertility -vasectomy ~ 30 years ago  No chief complaint on file.   HPI: Christopher Rose is a 62 y.o. male who presents today for a one year follow up.  Previous records reviewed.   SHIM ***        Score: 1-7 Severe ED 8-11 Moderate ED 12-16 Mild-Moderate ED 17-21 Mild ED 22-25 No ED   I PSS ***      Score:  1-7 Mild 8-19 Moderate 20-35 Severe  PMH: Past Medical History:  Diagnosis Date   Anal fissure    Arthritis    knees   BPH (benign prostatic hyperplasia)    Chronic insomnia    Chronic sinusitis    ED (erectile dysfunction)    HLD (hyperlipidemia)    HTN (hypertension)    Hypogonadism in male    Measles    Mumps    Palpitations    SVT (supraventricular tachycardia)     Surgical History: Past Surgical History:  Procedure Laterality Date   ANTERIOR CERVICAL DECOMP/DISCECTOMY FUSION N/A 12/06/2017   Procedure: ANTERIOR CERVICAL DECOMPRESSION/DISCECTOMY FUSION 1 LEVEL-C6-7;  Surgeon: Ninfa Meeker, MD;  Location: [facility] OR[facility];  Service: Neurosurgery;  Laterality: N/A;   cardiac ablation surgery  2010   COLONOSCOPY WITH PROPOFOL  N/A 12/02/2015   Procedure: COLONOSCOPY WITH PROPOFOL;  Surgeon: Midge Minium, MD;  Location: [facility];  Service: Endoscopy;  Laterality: N/A;   HERNIA REPAIR  1990   POLYPECTOMY  12/02/2015   Procedure: POLYPECTOMY;  Surgeon: Midge Minium, MD;  Location: [facility];  Service: Endoscopy;;   VASECTOMY  1992    Home Medications:  Allergies as of 09/22/2022       Reactions   Amoxicillin Rash   Penicillins Rash, Other (See Comments)   Has patient had a PCN reaction causing immediate rash, facial/tongue/throat swelling, SOB or lightheadedness with hypotension: No Has patient had a PCN reaction causing severe rash involving mucus membranes or skin necrosis: No Has patient had a PCN reaction that required hospitalization: No Has patient had a PCN reaction occurring within the last 10 years: No If all of the above answers are "NO", then may proceed with Cephalosporin use.        Medication List        Accurate as of September 21, 2022  9:53 AM. If you have any questions, ask your nurse or doctor.          Clomid 50 MG tablet Generic drug: clomiPHENE TAKE 1/2 TABLET BY MOUTH DAILY *NOT COVERED BY INS   lisinopril 20 MG tablet Commonly known as: ZESTRIL TAKE 1 TABLET BY MOUTH EVERY DAY   meloxicam 15 MG tablet Commonly known as: MOBIC TAKE 1  TABLET (15 MG TOTAL) BY MOUTH DAILY.   tadalafil 20 MG tablet Commonly known as: Cialis Take 1 tablet (20 mg total) by mouth every three (3) days as needed for erectile dysfunction. Every 3 DAYS prn   zolpidem 10 MG tablet Commonly known as: AMBIEN Take 1 tablet (10 mg total) by mouth at bedtime as needed for sleep.        Allergies:  Allergies  Allergen Reactions   Amoxicillin Rash   Penicillins Rash and Other (See Comments)    Has patient had a PCN reaction causing immediate rash, facial/tongue/throat swelling, SOB or lightheadedness with hypotension: No Has patient had a PCN reaction causing severe rash  involving mucus membranes or skin necrosis: No Has patient had a PCN reaction that required hospitalization: No Has patient had a PCN reaction occurring within the last 10 years: No If all of the above answers are "NO", then may proceed with Cephalosporin use.     Family History: Family History  Problem Relation Age of Onset   Diabetes Mellitus II Mother    Colon cancer Paternal Grandfather    Heart attack Father    Kidney disease Neg Hx    Prostate cancer Neg Hx    Bladder Cancer Neg Hx    Kidney cancer Neg Hx     Social History:  reports that he has never smoked. He has never used smokeless tobacco. He reports current alcohol use. He reports that he does not use drugs.  ROS: Pertinent review of systems can be found in history of present illness  Physical Exam: There were no vitals taken for this visit.  Constitutional:  Well nourished. Alert and oriented, No acute distress. HEENT: [place] AT, moist mucus membranes.  Trachea midline, no masses. Cardiovascular: No clubbing, cyanosis, or edema. Respiratory: Normal respiratory effort, no increased work of breathing. GI: Abdomen is soft, non tender, non distended, no abdominal masses. Liver and spleen not palpable.  No hernias appreciated.  Stool sample for occult testing is not indicated.   GU: No CVA tenderness.  No bladder fullness or masses.  Patient with circumcised/uncircumcised phallus. ***Foreskin easily retracted***  Urethral meatus is patent.  No penile discharge. No penile lesions or rashes. Scrotum without lesions, cysts, rashes and/or edema.  Testicles are located scrotally bilaterally. No masses are appreciated in the testicles. Left and right epididymis are normal. Rectal: Patient with  normal sphincter tone. Anus and perineum without scarring or rashes. No rectal masses are appreciated. Prostate is approximately *** grams, *** nodules are appreciated. Seminal vesicles are normal. Skin: No rashes, bruises or suspicious  lesions. Lymph: No cervical or inguinal adenopathy. Neurologic: Grossly intact, no focal deficits, moving all 4 extremities. Psychiatric: Normal mood and affect.   Laboratory Data: Results for orders placed or performed in visit on 09/16/22  Testosterone  Result Value Ref Range   Testosterone 933 (H) 264 - 916 ng/dL  PSA  Result Value Ref Range   Prostate Specific Ag, Serum 2.4 0.0 - 4.0 ng/mL  Hepatic function panel  Result Value Ref Range   Total Protein 6.5 6.0 - 8.5 g/dL   Albumin 4.4 3.9 - 4.9 g/dL   Bilirubin Total 0.5 0.0 - 1.2 mg/dL   Bilirubin, Direct 5.57 0.00 - 0.40 mg/dL   Alkaline Phosphatase 122 (H) 44 - 121 IU/L   AST 25 0 - 40 IU/L   ALT 32 0 - 44 IU/L  Hemoglobin and Hematocrit, Blood  Result Value Ref Range   Hemoglobin 14.6 13.0 -  17.7 g/dL   Hematocrit 78.2 95.6 - 51.0 %      Latest Ref Rng & Units 09/16/2022    8:24 AM 03/17/2022    8:41 AM 09/11/2021    9:25 AM  CMP  Total Protein 6.0 - 8.5 g/dL 6.5  6.2  6.6   Total Bilirubin 0.0 - 1.2 mg/dL 0.5  0.6  0.5   Alkaline Phos 44 - 121 IU/L 122  112  128   AST 0 - 40 IU/L 25  23  24    ALT 0 - 44 IU/L 32  29  25        Latest Ref Rng & Units 09/16/2022    8:24 AM 03/17/2022    8:41 AM 09/11/2021    9:25 AM  CBC  Hemoglobin 13.0 - 17.7 g/dL 21.3  08.6  57.8   Hematocrit 37.5 - 51.0 % 43.7  42.8  42.1   I reviewed the labs  Assessment & Plan:    1. Testosterone deficiency  -testosterone levels are therapeutic -H & H WNL -LFT's stable -continue Clomid 50 mg, 1/2 tablet daily  2. BPH with LUTS -PSA stable -DRE benign -continue conservative management, avoiding bladder irritants and timed voiding's  3. Erectile dysfunction:    - no intercourse since 2021    No follow-ups on file.  Cloretta Ned  [facility] [address] [place], [place] [address] [phone]

## 2022-09-22 ENCOUNTER — Encounter: Payer: Self-pay | Admitting: Urology

## 2022-09-22 ENCOUNTER — Ambulatory Visit: Payer: BC Managed Care – PPO | Admitting: Urology

## 2022-09-22 VITALS — BP 124/76 | HR 79 | Ht 65.0 in | Wt 189.0 lb

## 2022-09-22 DIAGNOSIS — N529 Male erectile dysfunction, unspecified: Secondary | ICD-10-CM | POA: Diagnosis not present

## 2022-09-22 DIAGNOSIS — N401 Enlarged prostate with lower urinary tract symptoms: Secondary | ICD-10-CM | POA: Diagnosis not present

## 2022-09-22 DIAGNOSIS — N138 Other obstructive and reflux uropathy: Secondary | ICD-10-CM

## 2022-09-22 DIAGNOSIS — E291 Testicular hypofunction: Secondary | ICD-10-CM

## 2022-09-22 DIAGNOSIS — E349 Endocrine disorder, unspecified: Secondary | ICD-10-CM

## 2022-09-22 MED ORDER — CLOMID 50 MG PO TABS
ORAL_TABLET | ORAL | 3 refills | Status: DC
Start: 2022-09-22 — End: 2023-10-05

## 2022-10-01 DIAGNOSIS — R0600 Dyspnea, unspecified: Secondary | ICD-10-CM | POA: Diagnosis not present

## 2022-10-01 DIAGNOSIS — E7800 Pure hypercholesterolemia, unspecified: Secondary | ICD-10-CM | POA: Diagnosis not present

## 2022-10-01 DIAGNOSIS — I10 Essential (primary) hypertension: Secondary | ICD-10-CM | POA: Diagnosis not present

## 2022-10-01 DIAGNOSIS — Z0000 Encounter for general adult medical examination without abnormal findings: Secondary | ICD-10-CM | POA: Diagnosis not present

## 2022-10-01 DIAGNOSIS — R7303 Prediabetes: Secondary | ICD-10-CM | POA: Diagnosis not present

## 2022-10-01 DIAGNOSIS — Z23 Encounter for immunization: Secondary | ICD-10-CM | POA: Diagnosis not present

## 2022-12-22 ENCOUNTER — Encounter: Payer: Self-pay | Admitting: Dermatology

## 2022-12-22 ENCOUNTER — Ambulatory Visit: Payer: BC Managed Care – PPO | Admitting: Dermatology

## 2022-12-22 DIAGNOSIS — W908XXA Exposure to other nonionizing radiation, initial encounter: Secondary | ICD-10-CM

## 2022-12-22 DIAGNOSIS — Z7189 Other specified counseling: Secondary | ICD-10-CM

## 2022-12-22 DIAGNOSIS — D229 Melanocytic nevi, unspecified: Secondary | ICD-10-CM

## 2022-12-22 DIAGNOSIS — L814 Other melanin hyperpigmentation: Secondary | ICD-10-CM

## 2022-12-22 DIAGNOSIS — L820 Inflamed seborrheic keratosis: Secondary | ICD-10-CM | POA: Diagnosis not present

## 2022-12-22 DIAGNOSIS — L821 Other seborrheic keratosis: Secondary | ICD-10-CM

## 2022-12-22 DIAGNOSIS — D1801 Hemangioma of skin and subcutaneous tissue: Secondary | ICD-10-CM

## 2022-12-22 DIAGNOSIS — L570 Actinic keratosis: Secondary | ICD-10-CM

## 2022-12-22 DIAGNOSIS — L578 Other skin changes due to chronic exposure to nonionizing radiation: Secondary | ICD-10-CM | POA: Diagnosis not present

## 2022-12-22 DIAGNOSIS — Z1283 Encounter for screening for malignant neoplasm of skin: Secondary | ICD-10-CM | POA: Diagnosis not present

## 2022-12-22 MED ORDER — FLUOROURACIL 5 % EX CREA: TOPICAL_CREAM | CUTANEOUS | 2 refills | Status: DC

## 2022-12-22 NOTE — Patient Instructions (Addendum)
Cryotherapy Aftercare  Wash gently with soap and water everyday.   Apply Vaseline and Band-Aid daily until healed.    - Start 5-fluorouracil cream twice a day up to 4 weeks as tolerated to affected areas including temples and forehead.   Reviewed course of treatment and expected reaction.  Patient advised to expect inflammation and crusting and advised that erosions are possible.  Patient advised to be diligent with sun protection during and after treatment. Handout with details of how to apply medication and what to expect provided. Counseled to keep medication out of reach of children and pets. Can start after New Year holiday.      Recommend daily broad spectrum sunscreen SPF 30+ to sun-exposed areas, reapply every 2 hours as needed. Call for new or changing lesions.  Staying in the shade or wearing long sleeves, sun glasses (UVA+UVB protection) and wide brim hats (4-inch brim around the entire circumference of the hat) are also recommended for sun protection.      Melanoma ABCDEs  Melanoma is the most dangerous type of skin cancer, and is the leading cause of death from skin disease.  You are more likely to develop melanoma if you: Have light-colored skin, light-colored eyes, or red or blond hair Spend a lot of time in the sun Tan regularly, either outdoors or in a tanning bed Have had blistering sunburns, especially during childhood Have a close family member who has had a melanoma Have atypical moles or large birthmarks  Early detection of melanoma is key since treatment is typically straightforward and cure rates are extremely high if we catch it early.   The first sign of melanoma is often a change in a mole or a new dark spot.  The ABCDE system is a way of remembering the signs of melanoma.  A for asymmetry:  The two halves do not match. B for border:  The edges of the growth are irregular. C for color:  A mixture of colors are present instead of an even brown color. D  for diameter:  Melanomas are usually (but not always) greater than 6mm - the size of a pencil eraser. E for evolution:  The spot keeps changing in size, shape, and color.  Please check your skin once per month between visits. You can use a small mirror in front and a large mirror behind you to keep an eye on the back side or your body.   If you see any new or changing lesions before your next follow-up, please call to schedule a visit.  Please continue daily skin protection including broad spectrum sunscreen SPF 30+ to sun-exposed areas, reapplying every 2 hours as needed when you're outdoors.   Staying in the shade or wearing long sleeves, sun glasses (UVA+UVB protection) and wide brim hats (4-inch brim around the entire circumference of the hat) are also recommended for sun protection.      Due to recent changes in healthcare laws, you may see results of your pathology and/or laboratory studies on MyChart before the doctors have had a chance to review them. We understand that in some cases there may be results that are confusing or concerning to you. Please understand that not all results are received at the same time and often the doctors may need to interpret multiple results in order to provide you with the best plan of care or course of treatment. Therefore, we ask that you please give [place] 2 business days to thoroughly review all your results before contacting  the office for clarification. Should we see a critical lab result, you will be contacted sooner.   If You Need Anything After Your Visit  If you have any questions or concerns for your doctor, please call our main line at [phone] and press option 4 to reach your doctor's medical assistant. If no one answers, please leave a voicemail as directed and we will return your call as soon as possible. Messages left after 4 pm will be answered the following business day.   You may also send [place] a message via MyChart. We typically respond to  MyChart messages within 1-2 business days.  For prescription refills, please ask your pharmacy to contact our office. Our fax number is [phone].  If you have an urgent issue when the clinic is closed that cannot wait until the next business day, you can page your doctor at the number below.    Please note that while we do our best to be available for urgent issues outside of office hours, we are not available 24/7.   If you have an urgent issue and are unable to reach [place], you may choose to seek medical care at your doctor's office, retail clinic, urgent care center, or emergency room.  If you have a medical emergency, please immediately call 911 or go to the emergency department.  Pager Numbers  - Dr. Gwen Pounds: [phone]  - Dr. Roseanne Reno: [phone]  - Dr. Katrinka Blazing: [phone]   In the event of inclement weather, please call our main line at [phone] for an update on the status of any delays or closures.  Dermatology Medication Tips: Please keep the boxes that topical medications come in in order to help keep track of the instructions about where and how to use these. Pharmacies typically print the medication instructions only on the boxes and not directly on the medication tubes.   If your medication is too expensive, please contact our office at [phone] option 4 or send [place] a message through MyChart.   We are unable to tell what your co-pay for medications will be in advance as this is different depending on your insurance coverage. However, we may be able to find a substitute medication at lower cost or fill out paperwork to get insurance to cover a needed medication.   If a prior authorization is required to get your medication covered by your insurance company, please allow [place] 1-2 business days to complete this process.  Drug prices often vary depending on where the prescription is filled and some pharmacies may offer cheaper prices.  The website www.goodrx.com  contains coupons for medications through different pharmacies. The prices here do not account for what the cost may be with help from insurance (it may be cheaper with your insurance), but the website can give you the price if you did not use any insurance.  - You can print the associated coupon and take it with your prescription to the pharmacy.  - You may also stop by our office during regular business hours and pick up a GoodRx coupon card.  - If you need your prescription sent electronically to a different pharmacy, notify our office through [facility] or by phone at [phone] option 4.     Si Usted Necesita Algo Despus de Su Visita  Tambin puede enviarnos un mensaje a travs de Clinical cytogeneticist. Por lo general respondemos a los mensajes de MyChart en el transcurso de 1 a 2 das hbiles.  Para renovar recetas, por favor pida a  su farmacia que se ponga en contacto con nuestra oficina. Annie Sable de fax es [place] [phone].  Si tiene un asunto urgente cuando la clnica est cerrada y que no puede esperar hasta el siguiente da hbil, puede llamar/localizar a su doctor(a) al nmero que aparece a continuacin.   Por favor, tenga en cuenta que aunque hacemos todo lo posible para estar disponibles para asuntos urgentes fuera del horario de [place], no estamos disponibles las 24 horas del da, los 7 [address]  [address] de la [place].   Si tiene un problema urgente y no puede comunicarse con nosotros, puede optar por buscar atencin mdica  en el consultorio de su doctor(a), en una clnica privada, en un centro de atencin urgente o en una sala de emergencias.  Si tiene Engineer, drilling, por favor llame inmediatamente al 911 o vaya a la sala de emergencias.  Nmeros de bper  - Dr. Gwen Pounds: [phone]  - Dra. Roseanne Reno: [phone]  - Dr. Katrinka Blazing: [phone]   En caso de inclemencias del tiempo, por favor llame a Lacy Duverney principal al [phone] para una actualizacin sobre el Rule  de cualquier retraso o cierre.  Consejos para la medicacin en dermatologa: Por favor, guarde las cajas en las que vienen los medicamentos de uso tpico para ayudarle a seguir las instrucciones sobre dnde y cmo usarlos. Las farmacias generalmente imprimen las instrucciones del medicamento slo en las cajas y no directamente en los tubos del [place].   Si su medicamento es muy caro, por favor, pngase en contacto con Rolm Gala llamando al [phone] y presione la opcin 4 o envenos un mensaje a travs de Clinical cytogeneticist.   No podemos decirle cul ser su copago por los medicamentos por adelantado ya que esto es diferente dependiendo de la cobertura de su seguro. Sin embargo, es posible que podamos encontrar un medicamento sustituto a Audiological scientist un formulario para que el seguro cubra el medicamento que se considera necesario.   Si se requiere una autorizacin previa para que su compaa de seguros [place] su medicamento, por favor permtanos de 1 a 2 das hbiles para completar [address]  Los precios de los medicamentos varan con frecuencia dependiendo del Environmental consultant de dnde se surte la receta y alguna farmacias pueden ofrecer precios ms baratos.  El sitio web www.goodrx.com tiene cupones para medicamentos de Health and safety inspector. Los precios aqu no tienen en cuenta lo que podra costar con la ayuda del seguro (puede ser ms barato con su seguro), pero el sitio web puede darle el precio si no utiliz Tourist information centre manager.  - Puede imprimir el cupn correspondiente y llevarlo con su receta a la farmacia.  - Tambin puede pasar por nuestra oficina durante el horario de atencin regular y Education officer, museum una tarjeta de cupones de GoodRx.  - Si necesita que su receta se enve electrnicamente a una farmacia diferente, informe a nuestra oficina a travs de MyChart de [place] o por telfono llamando al [phone] y presione la opcin 4.

## 2022-12-22 NOTE — Progress Notes (Signed)
Follow-Up Visit   Subjective  Christopher Rose is a 62 y.o. male who presents for the following: Skin Cancer Screening and Full Body Skin Exam. No personal hx of skin cancer or dysplastic nevi. Areas of concern on shoulders and chest.   The patient presents for Total-Body Skin Exam (TBSE) for skin cancer screening and mole check. The patient has spots, moles and lesions to be evaluated, some may be new or changing and the patient may have concern these could be cancer.    The following portions of the chart were reviewed this encounter and updated as appropriate: medications, allergies, medical history  Review of Systems:  No other skin or systemic complaints except as noted in HPI or Assessment and Plan.  Objective  Well appearing patient in no apparent distress; mood and affect are within normal limits.  A full examination was performed including scalp, head, eyes, ears, nose, lips, neck, chest, axillae, abdomen, back, buttocks, bilateral upper extremities, bilateral lower extremities, hands, feet, fingers, toes, fingernails, and toenails. All findings within normal limits unless otherwise noted below.   Relevant physical exam findings are noted in the Assessment and Plan.  L Shoulder - Anterior x2, L chest x1, R shoulder x1, R lower back x1 (5) Erythematous keratotic or waxy stuck-on papule or plaque. Left Lower Leg - Anterior x2 (2) Erythematous thin papules/macules with gritty scale.   Assessment & Plan   SKIN CANCER SCREENING PERFORMED TODAY.  ACTINIC DAMAGE WITH PRECANCEROUS ACTINIC KERATOSES Counseling for Topical Chemotherapy Management: Patient exhibits: - Severe, confluent actinic changes with pre-cancerous actinic keratoses that is secondary to cumulative UV radiation exposure over time - Condition that is severe; chronic, not at goal. - diffuse scaly erythematous macules and papules with underlying dyspigmentation - Discussed Prescription "Field Treatment" topical  Chemotherapy for Severe, Chronic Confluent Actinic Changes with Pre-Cancerous Actinic Keratoses Field treatment involves treatment of an entire area of skin that has confluent Actinic Changes (Sun/ Ultraviolet light damage) and PreCancerous Actinic Keratoses by method of PhotoDynamic Therapy (PDT) and/or prescription Topical Chemotherapy agents such as 5-fluorouracil, 5-fluorouracil/calcipotriene, and/or imiquimod.  The purpose is to decrease the number of clinically evident and subclinical PreCancerous lesions to prevent progression to development of skin cancer by chemically destroying early precancer changes that may or may not be visible.  It has been shown to reduce the risk of developing skin cancer in the treated area. As a result of treatment, redness, scaling, crusting, and open sores may occur during treatment course. One or more than one of these methods may be used and may have to be used several times to control, suppress and eliminate the PreCancerous changes. Discussed treatment course, expected reaction, and possible side effects. - Recommend daily broad spectrum sunscreen SPF 30+ to sun-exposed areas, reapply every 2 hours as needed.  - Staying in the shade or wearing long sleeves, sun glasses (UVA+UVB protection) and wide brim hats (4-inch brim around the entire circumference of the hat) are also recommended. - Call for new or changing lesions.  - Start 5-fluorouracil cream twice a day up to 4 weeks as tolerated to affected areas including temples and forehead.   Reviewed course of treatment and expected reaction.  Patient advised to expect inflammation and crusting and advised that erosions are possible.  Patient advised to be diligent with sun protection during and after treatment. Handout with details of how to apply medication and what to expect provided. Counseled to keep medication out of reach of children and pets.  LENTIGINES, SEBORRHEIC KERATOSES, HEMANGIOMAS - Benign normal  skin lesions - Benign-appearing - Call for any changes  MELANOCYTIC NEVI - Tan-brown and/or pink-flesh-colored symmetric macules and papules - Benign appearing on exam today - Observation - Call clinic for new or changing moles - Recommend daily use of broad spectrum spf 30+ sunscreen to sun-exposed areas.       INFLAMED SEBORRHEIC KERATOSIS (5) L Shoulder - Anterior x2, L chest x1, R shoulder x1, R lower back x1 (5) Symptomatic, irritating, patient would like treated. Destruction of lesion - L Shoulder - Anterior x2, L chest x1, R shoulder x1, R lower back x1 (5) Complexity: simple   Destruction method: cryotherapy   Informed consent: discussed and consent obtained   Timeout:  patient name, date of birth, surgical site, and procedure verified Lesion destroyed using liquid nitrogen: Yes   Region frozen until ice ball extended beyond lesion: Yes   Cryo cycles: 1 or 2. Outcome: patient tolerated procedure well with no complications   Post-procedure details: wound care instructions given   Additional details:  Prior to procedure, discussed risks of blister formation, small wound, skin dyspigmentation, or rare scar following cryotherapy. Recommend Vaseline ointment to treated areas while healing.  AK (ACTINIC KERATOSIS) (2) Left Lower Leg - Anterior x2 (2) Actinic keratoses are precancerous spots that appear secondary to cumulative UV radiation exposure/sun exposure over time. They are chronic with expected duration over 1 year. A portion of actinic keratoses will progress to squamous cell carcinoma of the skin. It is not possible to reliably predict which spots will progress to skin cancer and so treatment is recommended to prevent development of skin cancer.  Recommend daily broad spectrum sunscreen SPF 30+ to sun-exposed areas, reapply every 2 hours as needed.  Recommend staying in the shade or wearing long sleeves, sun glasses (UVA+UVB protection) and wide brim hats (4-inch brim  around the entire circumference of the hat). Call for new or changing lesions. Destruction of lesion - Left Lower Leg - Anterior x2 (2) Complexity: simple   Destruction method: cryotherapy   Informed consent: discussed and consent obtained   Timeout:  patient name, date of birth, surgical site, and procedure verified Lesion destroyed using liquid nitrogen: Yes   Region frozen until ice ball extended beyond lesion: Yes   Cryo cycles: 1 or 2. Outcome: patient tolerated procedure well with no complications   Post-procedure details: wound care instructions given   Additional details:  Prior to procedure, discussed risks of blister formation, small wound, skin dyspigmentation, or rare scar following cryotherapy. Recommend Vaseline ointment to treated areas while healing.    Return for AK Follow Up 2-3 months.  I, Lawson Radar, CMA, am acting as scribe for Elie Goody, MD.   Documentation: I have reviewed the above documentation for accuracy and completeness, and I agree with the above.  Elie Goody, MD

## 2023-02-02 ENCOUNTER — Encounter: Payer: Self-pay | Admitting: Dermatology

## 2023-02-25 ENCOUNTER — Encounter: Payer: Self-pay | Admitting: Dermatology

## 2023-02-25 ENCOUNTER — Ambulatory Visit: Payer: BC Managed Care – PPO | Admitting: Dermatology

## 2023-02-25 DIAGNOSIS — L820 Inflamed seborrheic keratosis: Secondary | ICD-10-CM

## 2023-02-25 DIAGNOSIS — L578 Other skin changes due to chronic exposure to nonionizing radiation: Secondary | ICD-10-CM | POA: Diagnosis not present

## 2023-02-25 DIAGNOSIS — W098XXA Fall on or from other playground equipment, initial encounter: Secondary | ICD-10-CM

## 2023-02-25 DIAGNOSIS — L821 Other seborrheic keratosis: Secondary | ICD-10-CM

## 2023-02-25 DIAGNOSIS — L570 Actinic keratosis: Secondary | ICD-10-CM

## 2023-02-25 NOTE — Patient Instructions (Signed)

## 2023-02-25 NOTE — Progress Notes (Signed)
Follow-Up Visit   Subjective  Christopher Rose is a 63 y.o. male who presents for the following: 2 month AK follow up. Patient used 5FU twice daily x 4 weeks to forehead and temples. Patient advises he had a very strong reaction similar to a very bad sunburn.   The patient has spots, moles and lesions to be evaluated, some may be new or changing and the patient may have concern these could be cancer.   The following portions of the chart were reviewed this encounter and updated as appropriate: medications, allergies, medical history  Review of Systems:  No other skin or systemic complaints except as noted in HPI or Assessment and Plan.  Objective  Well appearing patient in no apparent distress; mood and affect are within normal limits.   A focused examination was performed of the following areas: Face, scalp, neck  Relevant exam findings are noted in the Assessment and Plan.  scalp x 4 (4) Erythematous thin papules/macules with gritty scale.  Scalp & face x 15 (15) Erythematous stuck-on, waxy papule or plaque  Assessment & Plan     AK (ACTINIC KERATOSIS) (4) scalp x 4 (4) Actinic keratoses are precancerous spots that appear secondary to cumulative UV radiation exposure/sun exposure over time. They are chronic with expected duration over 1 year. A portion of actinic keratoses will progress to squamous cell carcinoma of the skin. It is not possible to reliably predict which spots will progress to skin cancer and so treatment is recommended to prevent development of skin cancer.  Recommend daily broad spectrum sunscreen SPF 30+ to sun-exposed areas, reapply every 2 hours as needed.  Recommend staying in the shade or wearing long sleeves, sun glasses (UVA+UVB protection) and wide brim hats (4-inch brim around the entire circumference of the hat). Call for new or changing lesions. Destruction of lesion - scalp x 4 (4) Complexity: simple   Destruction method: cryotherapy   Informed  consent: discussed and consent obtained   Timeout:  patient name, date of birth, surgical site, and procedure verified Lesion destroyed using liquid nitrogen: Yes   Region frozen until ice ball extended beyond lesion: Yes   Outcome: patient tolerated procedure well with no complications   Post-procedure details: wound care instructions given   INFLAMED SEBORRHEIC KERATOSIS (15) Scalp & face x 15 (15) Symptomatic, irritating, patient would like treated.  Benign-appearing.  Call clinic for new or changing lesions.    Destruction of lesion - Scalp & face x 15 (15) Complexity: simple   Destruction method: cryotherapy   Informed consent: discussed and consent obtained   Timeout:  patient name, date of birth, surgical site, and procedure verified Lesion destroyed using liquid nitrogen: Yes   Region frozen until ice ball extended beyond lesion: Yes   Outcome: patient tolerated procedure well with no complications   Post-procedure details: wound care instructions given    ACTINIC DAMAGE - chronic, secondary to cumulative UV radiation exposure/sun exposure over time - diffuse scaly erythematous macules with underlying dyspigmentation - Recommend daily broad spectrum sunscreen SPF 30+ to sun-exposed areas, reapply every 2 hours as needed.  - Recommend staying in the shade or wearing long sleeves, sun glasses (UVA+UVB protection) and wide brim hats (4-inch brim around the entire circumference of the hat). - Call for new or changing lesions. - Patient with improvement at forehead and temples s/p topical 5FU  SEBORRHEIC KERATOSIS - Stuck-on, waxy, tan-brown papules and/or plaques  - Benign-appearing - Discussed benign etiology and prognosis. -  Observe - Call for any changes   Return in about 8 months (around 10/25/2023) for AK follow up, with Dr. Katrinka Blazing or Dr. Beverlyn Roux, RMA, am acting as scribe for Armida Sans, MD .   Documentation: I have reviewed the above  documentation for accuracy and completeness, and I agree with the above.  Armida Sans, MD

## 2023-03-01 ENCOUNTER — Encounter: Payer: Self-pay | Admitting: Dermatology

## 2023-05-20 ENCOUNTER — Encounter: Payer: Self-pay | Admitting: *Deleted

## 2023-06-04 ENCOUNTER — Encounter: Payer: Self-pay | Admitting: *Deleted

## 2023-06-04 ENCOUNTER — Ambulatory Visit: Admitting: Anesthesiology

## 2023-06-04 ENCOUNTER — Ambulatory Visit
Admission: RE | Admit: 2023-06-04 | Discharge: 2023-06-04 | Disposition: A | Discharge status: Home / Self Care | Attending: Gastroenterology | Admitting: Gastroenterology

## 2023-06-04 ENCOUNTER — Encounter
Admission: RE | Disposition: A | Discharge status: Home / Self Care | Payer: Self-pay | Source: Home / Self Care | Attending: Gastroenterology

## 2023-06-04 DIAGNOSIS — R7303 Prediabetes: Secondary | ICD-10-CM | POA: Insufficient documentation

## 2023-06-04 DIAGNOSIS — Z1211 Encounter for screening for malignant neoplasm of colon: Secondary | ICD-10-CM | POA: Diagnosis present

## 2023-06-04 DIAGNOSIS — K640 First degree hemorrhoids: Secondary | ICD-10-CM | POA: Diagnosis not present

## 2023-06-04 DIAGNOSIS — I10 Essential (primary) hypertension: Secondary | ICD-10-CM | POA: Diagnosis not present

## 2023-06-04 DIAGNOSIS — D124 Benign neoplasm of descending colon: Secondary | ICD-10-CM | POA: Diagnosis not present

## 2023-06-04 HISTORY — PX: COLONOSCOPY: SHX5424

## 2023-06-04 HISTORY — DX: Prediabetes: R73.03

## 2023-06-04 LAB — GLUCOSE, CAPILLARY: Glucose-Capillary: 133 mg/dL — ABNORMAL HIGH (ref 70–99)

## 2023-06-04 SURGERY — COLONOSCOPY
Anesthesia: General

## 2023-06-04 MED ORDER — PROPOFOL 500 MG/50ML IV EMUL
INTRAVENOUS | Status: DC | PRN
  Administered 2023-06-04: 75 ug/kg/min via INTRAVENOUS

## 2023-06-04 MED ORDER — DEXMEDETOMIDINE HCL IN NACL 80 MCG/20ML IV SOLN
INTRAVENOUS | Status: DC | PRN
  Administered 2023-06-04: 20 ug via INTRAVENOUS

## 2023-06-04 MED ORDER — EPHEDRINE SULFATE-NACL 50-0.9 MG/10ML-% IV SOSY
PREFILLED_SYRINGE | INTRAVENOUS | Status: DC | PRN
  Administered 2023-06-04: 10 mg via INTRAVENOUS
  Administered 2023-06-04: 15 mg via INTRAVENOUS

## 2023-06-04 MED ORDER — SODIUM CHLORIDE 0.9 % IV SOLN: INTRAVENOUS | Status: DC

## 2023-06-04 MED ORDER — LIDOCAINE HCL (PF) 2 % IJ SOLN
INTRAMUSCULAR | Status: AC
  Filled 2023-06-04: qty 5

## 2023-06-04 MED ORDER — LIDOCAINE HCL (CARDIAC) PF 100 MG/5ML IV SOSY
PREFILLED_SYRINGE | INTRAVENOUS | Status: DC | PRN
  Administered 2023-06-04: 80 mg via INTRAVENOUS

## 2023-06-04 MED ORDER — EPHEDRINE 5 MG/ML INJ
INTRAVENOUS | Status: AC
  Filled 2023-06-04: qty 5

## 2023-06-04 MED ORDER — PROPOFOL 10 MG/ML IV BOLUS
INTRAVENOUS | Status: DC | PRN
  Administered 2023-06-04 (×2): 30 mg via INTRAVENOUS
  Administered 2023-06-04: 50 mg via INTRAVENOUS

## 2023-06-04 NOTE — H&P (Signed)
 Outpatient short stay form Pre-procedure 06/04/2023  Shane Darling, MD  Primary Physician: Nikki Barters, MD  Reason for visit:  Surveillance  History of present illness:    63 y/o gentleman with history of hypertension, BPH, and HLD here for surveillance colonoscopy. Last colonoscopy was 7 years ago with small Ta's. No blood thinners. History of umbilical hernia repair. No first degree relatives with GI malignancies.    Current Facility-Administered Medications:    0.9 %  sodium chloride  infusion, , Intravenous, Continuous, Ieasha Boerema, Leanora Prophet, MD, Last Rate: 20 mL/hr at 06/04/23 0908, New Bag at 06/04/23 0908  Medications Prior to Admission  Medication Sig Dispense Refill Last Dose/Taking   clomiPHENE  (CLOMID ) 50 MG tablet TAKE 1/2 TABLET BY MOUTH DAILY *NOT COVERED BY INS 45 tablet 3 06/03/2023   lisinopril  (ZESTRIL ) 20 MG tablet TAKE 1 TABLET BY MOUTH EVERY DAY 90 tablet 3 06/04/2023 Morning   meloxicam  (MOBIC ) 15 MG tablet TAKE 1 TABLET (15 MG TOTAL) BY MOUTH DAILY. 90 tablet 0 06/03/2023   metformin (FORTAMET) 500 MG (OSM) 24 hr tablet Take 500 mg by mouth daily with breakfast.   06/03/2023   zolpidem  (AMBIEN ) 10 MG tablet Take 1 tablet (10 mg total) by mouth at bedtime as needed for sleep. 90 tablet 0 Past Week   fluorouracil  (EFUDEX ) 5 % cream Apply twice daily to temples and forehead up to 4 weeks as tolerated. 40 g 2    tadalafil  (CIALIS ) 20 MG tablet Take 1 tablet (20 mg total) by mouth every three (3) days as needed for erectile dysfunction. Every 3 DAYS prn 30 tablet 3      Allergies  Allergen Reactions   Amoxicillin Rash   Penicillins Rash and Other (See Comments)    Has patient had a PCN reaction causing immediate rash, facial/tongue/throat swelling, SOB or lightheadedness with hypotension: No Has patient had a PCN reaction causing severe rash involving mucus membranes or skin necrosis: No Has patient had a PCN reaction that required hospitalization: No Has  patient had a PCN reaction occurring within the last 10 years: No If all of the above answers are "NO", then may proceed with Cephalosporin use.      Past Medical History:  Diagnosis Date   Anal fissure    Arthritis    knees   BPH (benign prostatic hyperplasia)    Chronic insomnia    Chronic sinusitis    ED (erectile dysfunction)    HLD (hyperlipidemia)    HTN (hypertension)    Hypogonadism in male    Measles    Mumps    Palpitations    Pre-diabetes    SVT (supraventricular tachycardia) (HCC)    with ablation, started "lisinopril  and that helped"    Review of systems:  Otherwise negative.    Physical Exam  Gen: Alert, oriented. Appears stated age.  HEENT: PERRLA. Lungs: No respiratory distress CV: RRR Abd: soft, benign, no masses Ext: No edema    Planned procedures: Proceed with colonoscopy. The patient understands the nature of the planned procedure, indications, risks, alternatives and potential complications including but not limited to bleeding, infection, perforation, damage to internal organs and possible oversedation/side effects from anesthesia. The patient agrees and gives consent to proceed.  Please refer to procedure notes for findings, recommendations and patient disposition/instructions.     Shane Darling, MD [facility]

## 2023-06-04 NOTE — Interval H&P Note (Signed)
 History and Physical Interval Note:  06/04/2023 9:52 AM  Christopher Rose  has presented today for surgery, with the diagnosis of Z86.0100 (ICD-10-CM) - History of colon polyps.  The various methods of treatment have been discussed with the patient and family. After consideration of risks, benefits and other options for treatment, the patient has consented to  Procedure(s): COLONOSCOPY (N/A) as a surgical intervention.  The patient's history has been reviewed, patient examined, no change in status, stable for surgery.  I have reviewed the patient's chart and labs.  Questions were answered to the patient's satisfaction.     Shane Darling  Ok to proceed with colonoscopy

## 2023-06-04 NOTE — Transfer of Care (Signed)
 Immediate Anesthesia Transfer of Care Note  Patient: Christopher Rose  Procedure(s) Performed: COLONOSCOPY  Patient Location: PACU  Anesthesia Type:General  Level of Consciousness: sedated  Airway & Oxygen Therapy: Patient Spontanous Breathing  Post-op Assessment: Report given to RN and Post -op Vital signs reviewed and stable  Post vital signs: Reviewed and stable  Last Vitals:  Vitals Value Taken Time  BP 84/48 06/04/23 1018  Temp    Pulse 104 06/04/23 1018  Resp 21 06/04/23 1018  SpO2 96 % 06/04/23 1018  Vitals shown include unfiled device data.  Last Pain:  Vitals:   06/04/23 1018  TempSrc:   PainSc: 0-No pain         Complications: No notable events documented.

## 2023-06-04 NOTE — Anesthesia Preprocedure Evaluation (Signed)
 Anesthesia Evaluation  Patient identified by MRN, date of birth, ID band Patient awake    Reviewed: Allergy & Precautions, H&P , NPO status , Patient's Chart, lab work & pertinent test results  Airway Mallampati: III  TM Distance: >3 FB Neck ROM: full    Dental no notable dental hx.    Pulmonary neg pulmonary ROS   Pulmonary exam normal        Cardiovascular hypertension, Normal cardiovascular exam     Neuro/Psych negative neurological ROS  negative psych ROS   GI/Hepatic negative GI ROS, Neg liver ROS,,,  Endo/Other  prediabetes  Renal/GU negative Renal ROS  negative genitourinary   Musculoskeletal   Abdominal  (+) + obese  Peds  Hematology negative hematology ROS (+)   Anesthesia Other Findings Past Medical History: No date: Anal fissure No date: Arthritis     Comment:  knees No date: BPH (benign prostatic hyperplasia) No date: Chronic insomnia No date: Chronic sinusitis No date: ED (erectile dysfunction) No date: HLD (hyperlipidemia) No date: HTN (hypertension) No date: Hypogonadism in male No date: Measles No date: Mumps No date: Palpitations No date: Pre-diabetes No date: SVT (supraventricular tachycardia) (HCC)     Comment:  with ablation, started "lisinopril  and that helped"  Past Surgical History: 12/06/2017: ANTERIOR CERVICAL DECOMP/DISCECTOMY FUSION; N/A     Comment:  Procedure: ANTERIOR CERVICAL DECOMPRESSION/DISCECTOMY               FUSION 1 LEVEL-C6-7;  Surgeon: Natalie Bailey, MD;                Location: [facility];  Service: Neurosurgery;  Laterality:               N/A; 2010: cardiac ablation surgery 12/02/2015: COLONOSCOPY WITH PROPOFOL ; N/A     Comment:  Procedure: COLONOSCOPY WITH PROPOFOL ;  Surgeon: Marnee Sink, MD;  Location: [facility];  Service:               Endoscopy;  Laterality: N/A; 1990: HERNIA REPAIR 12/02/2015: POLYPECTOMY     Comment:   Procedure: POLYPECTOMY;  Surgeon: Marnee Sink, MD;                Location: [facility];  Service: Endoscopy;; 1992: VASECTOMY  BMI    Body Mass Index: 30.79 kg/m      Reproductive/Obstetrics negative OB ROS                             Anesthesia Physical Anesthesia Plan  ASA: 2  Anesthesia Plan: General   Post-op Pain Management:    Induction:   PONV Risk Score and Plan: Propofol  infusion and TIVA  Airway Management Planned:   Additional Equipment:   Intra-op Plan:   Post-operative Plan:   Informed Consent: I have reviewed the patients History and Physical, chart, labs and discussed the procedure including the risks, benefits and alternatives for the proposed anesthesia with the patient or authorized representative who has indicated his/her understanding and acceptance.     Dental Advisory Given  Plan Discussed with: CRNA and Surgeon  Anesthesia Plan Comments:        Anesthesia Quick Evaluation

## 2023-06-04 NOTE — Op Note (Signed)
 [facility] Gastroenterology Patient Name: Christopher Rose Procedure Date: 06/04/2023 9:52 AM MRN: [id number] Account #: [id number] Date of Birth: August 09, 1960 Admit Type: Outpatient Age: 63 Room: [facility] ENDO ROOM 3 Gender: Male Note Status: Finalized Instrument Name: Charlyn Cooley [id number] Procedure:             Colonoscopy Indications:           Surveillance: Personal history of adenomatous polyps                         on last colonoscopy > 5 years ago Providers:             Leida Puna MD, MD Referring MD:          Aldo Amble. Oletta Berry, MD (Referring MD) Medicines:             Monitored Anesthesia Care Complications:         No immediate complications. Estimated blood loss:                         Minimal. Procedure:             Pre-Anesthesia Assessment:                        - Prior to the procedure, a History and Physical was                         performed, and patient medications and allergies were                         reviewed. The patient is competent. The risks and                         benefits of the procedure and the sedation options and                         risks were discussed with the patient. All questions                         were answered and informed consent was obtained.                         Patient identification and proposed procedure were                         verified by the physician, the nurse, the                         anesthesiologist, the anesthetist and the technician                         in the endoscopy suite. Mental Status Examination:                         alert and oriented. Airway Examination: normal                         oropharyngeal airway and neck mobility. Respiratory  Examination: clear to auscultation. CV Examination:                         normal. Prophylactic Antibiotics: The patient does not                         require prophylactic antibiotics. Prior                          Anticoagulants: The patient has taken no anticoagulant                         or antiplatelet agents. ASA Grade Assessment: II - A                         patient with mild systemic disease. After reviewing                         the risks and benefits, the patient was deemed in                         satisfactory condition to undergo the procedure. The                         anesthesia plan was to use monitored anesthesia care                         (MAC). Immediately prior to administration of                         medications, the patient was re-assessed for adequacy                         to receive sedatives. The heart rate, respiratory                         rate, oxygen saturations, blood pressure, adequacy of                         pulmonary ventilation, and response to care were                         monitored throughout the procedure. The physical                         status of the patient was re-assessed after the                         procedure.                        After obtaining informed consent, the colonoscope was                         passed under direct vision. Throughout the procedure,                         the patient's blood pressure, pulse, and oxygen  saturations were monitored continuously. The                         Colonoscope was introduced through the anus and                         advanced to the the cecum, identified by appendiceal                         orifice and ileocecal valve. The colonoscopy was                         somewhat difficult due to significant looping.                         Successful completion of the procedure was aided by                         applying abdominal pressure. The patient tolerated the                         procedure well. The quality of the bowel preparation                         was adequate to identify polyps. The ileocecal valve,                          appendiceal orifice, and rectum were photographed. Findings:      The perianal and digital rectal examinations were normal.      A 3 mm polyp was found in the descending colon. The polyp was sessile.       The polyp was removed with a cold snare. Resection and retrieval were       complete. Estimated blood loss was minimal.      Internal hemorrhoids were found during retroflexion. The hemorrhoids       were Grade I (internal hemorrhoids that do not prolapse).      The exam was otherwise without abnormality on direct and retroflexion       views. Impression:            - One 3 mm polyp in the descending colon, removed with                         a cold snare. Resected and retrieved.                        - Internal hemorrhoids.                        - The examination was otherwise normal on direct and                         retroflexion views. Recommendation:        - Discharge patient to home.                        - Resume previous diet.                        -  Continue present medications.                        - Await pathology results.                        - Repeat colonoscopy in 7 years for surveillance.                        - Return to referring physician as previously                         scheduled. Procedure Code(s):     --- Professional ---                        [phone], Colonoscopy, flexible; with removal of                         tumor(s), polyp(s), or other lesion(s) by snare                         technique Diagnosis Code(s):     --- Professional ---                        Z86.010, Personal history of colonic polyps                        D12.4, Benign neoplasm of descending colon                        K64.0, First degree hemorrhoids CPT copyright 2022 American Medical Association. All rights reserved. The codes documented in this report are preliminary and upon coder review may  be revised to meet current compliance requirements. Leida Puna MD,  MD 06/04/2023 10:18:12 AM Number of Addenda: 0 Note Initiated On: 06/04/2023 9:52 AM Scope Withdrawal Time: 0 hours 7 minutes 59 seconds  Total Procedure Duration: 0 hours 14 minutes 56 seconds  Estimated Blood Loss:  Estimated blood loss was minimal.      [facility]

## 2023-06-07 LAB — SURGICAL PATHOLOGY

## 2023-06-07 NOTE — Anesthesia Postprocedure Evaluation (Signed)
 Anesthesia Post Note  Patient: Christopher Rose  Procedure(s) Performed: COLONOSCOPY  Patient location during evaluation: Endoscopy Anesthesia Type: General Level of consciousness: awake and alert Pain management: pain level controlled Vital Signs Assessment: post-procedure vital signs reviewed and stable Respiratory status: spontaneous breathing, nonlabored ventilation and respiratory function stable Cardiovascular status: blood pressure returned to baseline and stable Postop Assessment: no apparent nausea or vomiting Anesthetic complications: no   No notable events documented.   Last Vitals:  Vitals:   06/04/23 1028 06/04/23 1037  BP: (!) 89/59 93/62  Pulse: 93 84  Resp: 15 20  Temp:    SpO2: 96% 98%    Last Pain:  Vitals:   06/04/23 1037  TempSrc:   PainSc: 0-No pain                 Baltazar Bonier

## 2023-08-09 ENCOUNTER — Encounter: Payer: Self-pay | Admitting: Urology

## 2023-09-14 ENCOUNTER — Other Ambulatory Visit: Payer: Self-pay

## 2023-09-14 DIAGNOSIS — R972 Elevated prostate specific antigen [PSA]: Secondary | ICD-10-CM

## 2023-09-14 DIAGNOSIS — E349 Endocrine disorder, unspecified: Secondary | ICD-10-CM

## 2023-09-14 DIAGNOSIS — N138 Other obstructive and reflux uropathy: Secondary | ICD-10-CM

## 2023-09-15 LAB — CBC WITH DIFFERENTIAL/PLATELET
Basophils Absolute: 0 x10E3/uL (ref 0.0–0.2)
Basos: 0 %
EOS (ABSOLUTE): 0.1 x10E3/uL (ref 0.0–0.4)
Eos: 2 %
Hematocrit: 39.1 % (ref 37.5–51.0)
Hemoglobin: 13.1 g/dL (ref 13.0–17.7)
Immature Grans (Abs): 0 x10E3/uL (ref 0.0–0.1)
Immature Granulocytes: 0 %
Lymphocytes Absolute: 1 x10E3/uL (ref 0.7–3.1)
Lymphs: 19 %
MCH: 33.7 pg — ABNORMAL HIGH (ref 26.6–33.0)
MCHC: 33.5 g/dL (ref 31.5–35.7)
MCV: 101 fL — ABNORMAL HIGH (ref 79–97)
Monocytes Absolute: 0.3 x10E3/uL (ref 0.1–0.9)
Monocytes: 6 %
Neutrophils Absolute: 3.9 x10E3/uL (ref 1.4–7.0)
Neutrophils: 73 %
Platelets: 101 x10E3/uL — ABNORMAL LOW (ref 150–450)
RBC: 3.89 x10E6/uL — ABNORMAL LOW (ref 4.14–5.80)
RDW: 13.4 % (ref 11.6–15.4)
WBC: 5.5 x10E3/uL (ref 3.4–10.8)

## 2023-09-15 LAB — HEPATIC FUNCTION PANEL
ALT: 37 IU/L (ref 0–44)
AST: 36 IU/L (ref 0–40)
Albumin: 4.1 g/dL (ref 3.9–4.9)
Alkaline Phosphatase: 152 IU/L — ABNORMAL HIGH (ref 44–121)
Bilirubin Total: 0.5 mg/dL (ref 0.0–1.2)
Bilirubin, Direct: 0.26 mg/dL (ref 0.00–0.40)
Total Protein: 6.2 g/dL (ref 6.0–8.5)

## 2023-09-15 LAB — TESTOSTERONE: Testosterone: 1281 ng/dL — ABNORMAL HIGH (ref 264–916)

## 2023-09-15 LAB — PSA: Prostate Specific Ag, Serum: 2.5 ng/mL (ref 0.0–4.0)

## 2023-09-21 ENCOUNTER — Ambulatory Visit: Payer: Self-pay | Admitting: Urology

## 2023-09-23 ENCOUNTER — Ambulatory Visit: Admitting: Urology

## 2023-09-27 ENCOUNTER — Other Ambulatory Visit: Payer: Self-pay | Admitting: Urology

## 2023-09-27 DIAGNOSIS — E349 Endocrine disorder, unspecified: Secondary | ICD-10-CM

## 2023-10-04 NOTE — Progress Notes (Unsigned)
 02/22/2018 5:07 PM   Sharolyn CHRISTELLA Rouleau 1960-01-27 [id number]  Referring provider: Bertrum Charlie CROME, MD [address] [place],  [place] [address]  Urological history: 1.  Hypogonadism -contributing factors of age, history of mumps and prediabetes -Testosterone  (09/2023) 1,281 -Hemoglobin/hematocrit (09/2023) 13.1/39.1 -AST/ALT (09/2023) 36/37 -Clomid  50 mg, 1/2 tablet daily  2. BPH with LU TS  3. ED -Contributing factors of age, BPH, testosterone  deficiency and HTN -Cialis  20 mg, 1/2 tablet   4. Prostate cancer screening -PSA (09/2023) 2.5 -AUA guidelines recommend prostate cancer screening in men ages 24-69 every two to four years, he is being screened twice yearly due to history of elevated PSA and on TRT  -no family history of prostate cancer, ovarian cancer or breast cancer   5. Undesired fertility -vasectomy ~ 30 years ago  No chief complaint on file.   HPI: Christopher Rose is a 63 y.o. male who presents today for a one year follow up.  Previous records reviewed.   He reports good adherence to Clomid  50 mg, 1/2 tablet daily.  Denies new complaints of low libido, erectile dysfunction, fatigue, or mood changes.  No complaints of gynecomastia, visual changes, or thromboembolic symptoms.  Energy level, libido and overall sense of wellbeing being reported as stable/ improved compared to prior visit.    Testosterone  level (09/2203) 1,281  Hemoglobin/hematocrit (09/2023) 13.1/39.1  Liver enzymes (09/2023) stable  I PSS ***  He reports sensation of incomplete bladder emptying, urinary frequency, urinary intermittency, urinary urgency, a weak urinary stream, having to strain to void, nocturia x ***, leaking before being able to reach the restroom, leaking with coughing, leaking without awareness, and post void dribbling.     He is wearing *** pads//depends  daily.    Patient denies any modifying or aggravating factors.  Patient denies any recent UTI's, gross hematuria,  dysuria or suprapubic/flank pain.  Patient denies any fevers, chills, nausea or vomiting.  ***  He has a family history of PCa, colon cancer, ovarian cancer and/or breast cancer with ***.   He does not have a family history of PCa, colon cancer, ovarian cancer, and/or breast cancer .***     UA***  PVR***  PSA (09/2023) 2.5  Serum creatinine (09/2022) 1.3, eGFR 62  Hemoglobin A1c (03/2023) 6.4  Diuretics:  ***  Fluid consumptiom: ***  SHIM ***  He does not have confidence that he could get and keep an erection, his erections are not firm enough for penetrative intercourse, he has difficulty maintaining his erections,  and he is not finding intercourse satisfactory for him.  ***  Patient still having spontaneous erections.  ***   He denies any pain or curvature with erections.    He is not able to ejaculate, has pain with ejaculation, and has blood in his ejaculate fluid.   ***  Cholesterol (09/2022) 209  TSH (09/2022) 1.395   Tried and failed ***  PMH: Past Medical History:  Diagnosis Date   Anal fissure    Arthritis    knees   BPH (benign prostatic hyperplasia)    Chronic insomnia    Chronic sinusitis    ED (erectile dysfunction)    HLD (hyperlipidemia)    HTN (hypertension)    Hypogonadism in male    Measles    Mumps    Palpitations    Pre-diabetes    SVT (supraventricular tachycardia)    with ablation, started lisinopril  and that helped    Surgical History: Past Surgical History:  Procedure  Laterality Date   ANTERIOR CERVICAL DECOMP/DISCECTOMY FUSION N/A 12/06/2017   Procedure: ANTERIOR CERVICAL DECOMPRESSION/DISCECTOMY FUSION 1 LEVEL-C6-7;  Surgeon: Charley Yolanda LABOR, MD;  Location: [facility] ORS;  Service: Neurosurgery;  Laterality: N/A;   cardiac ablation surgery  2010   COLONOSCOPY N/A 06/04/2023   Procedure: COLONOSCOPY;  Surgeon: Maryruth Ole DASEN, MD;  Location: [facility] ENDOSCOPY;  Service: Endoscopy;  Laterality: N/A;   COLONOSCOPY WITH PROPOFOL  N/A  12/02/2015   Procedure: COLONOSCOPY WITH PROPOFOL ;  Surgeon: Rogelia Copping, MD;  Location: [facility];  Service: Endoscopy;  Laterality: N/A;   HERNIA REPAIR  1990   POLYPECTOMY  12/02/2015   Procedure: POLYPECTOMY;  Surgeon: Rogelia Copping, MD;  Location: [facility];  Service: Endoscopy;;   VASECTOMY  1992    Home Medications:  Allergies as of 10/05/2023       Reactions   Amoxicillin Rash   Penicillins Rash, Other (See Comments)   Has patient had a PCN reaction causing immediate rash, facial/tongue/throat swelling, SOB or lightheadedness with hypotension: No Has patient had a PCN reaction causing severe rash involving mucus membranes or skin necrosis: No Has patient had a PCN reaction that required hospitalization: No Has patient had a PCN reaction occurring within the last 10 years: No If all of the above answers are NO, then may proceed with Cephalosporin use.        Medication List        Accurate as of October 04, 2023  5:07 PM. If you have any questions, ask your nurse or doctor.          Clomid  50 MG tablet Generic drug: clomiPHENE  TAKE 1/2 TABLET BY MOUTH DAILY *NOT COVERED BY INS   fluorouracil  5 % cream Commonly known as: EFUDEX  Apply twice daily to temples and forehead up to 4 weeks as tolerated.   lisinopril  20 MG tablet Commonly known as: ZESTRIL  TAKE 1 TABLET BY MOUTH EVERY DAY   meloxicam  15 MG tablet Commonly known as: MOBIC  TAKE 1 TABLET (15 MG TOTAL) BY MOUTH DAILY.   metformin 500 MG (OSM) 24 hr tablet Commonly known as: FORTAMET Take 500 mg by mouth daily with breakfast.   tadalafil  20 MG tablet Commonly known as: Cialis  Take 1 tablet (20 mg total) by mouth every three (3) days as needed for erectile dysfunction. Every 3 DAYS prn   zolpidem  10 MG tablet Commonly known as: AMBIEN  Take 1 tablet (10 mg total) by mouth at bedtime as needed for sleep.        Allergies:  Allergies  Allergen Reactions   Amoxicillin Rash    Penicillins Rash and Other (See Comments)    Has patient had a PCN reaction causing immediate rash, facial/tongue/throat swelling, SOB or lightheadedness with hypotension: No Has patient had a PCN reaction causing severe rash involving mucus membranes or skin necrosis: No Has patient had a PCN reaction that required hospitalization: No Has patient had a PCN reaction occurring within the last 10 years: No If all of the above answers are NO, then may proceed with Cephalosporin use.     Family History: Family History  Problem Relation Age of Onset   Diabetes Mellitus II Mother    Colon cancer Paternal Grandfather    Heart attack Father    Kidney disease Neg Hx    Prostate cancer Neg Hx    Bladder Cancer Neg Hx    Kidney cancer Neg Hx     Social History:  reports that he has never smoked. He  has never used smokeless tobacco. He reports current alcohol use. He reports that he does not use drugs.  ROS: Pertinent review of systems can be found in history of present illness  Physical Exam: There were no vitals taken for this visit.  Constitutional:  Well nourished. Alert and oriented, No acute distress. HEENT:  AT, moist mucus membranes.  Trachea midline Cardiovascular: No clubbing, cyanosis, or edema. Respiratory: Normal respiratory effort, no increased work of breathing. GU: No CVA tenderness.  No bladder fullness or masses.  Patient with uncircumcised phallus. Foreskin easily retracted  Urethral meatus is patent.  No penile discharge. No penile lesions or rashes. Scrotum without lesions, cysts, rashes and/or edema.  Testicles are located scrotally bilaterally. No masses are appreciated in the testicles. Left and right epididymis are normal. Rectal: Patient with  normal sphincter tone. Anus and perineum without scarring or rashes. No rectal masses are appreciated. Prostate is approximately 50 + grams, no nodules are appreciated. Seminal vesicles could not be palpated. Neurologic:  Grossly intact, no focal deficits, moving all 4 extremities. Psychiatric: Normal mood and affect.   Laboratory Data: See HPI and EPIC I reviewed the labs  Assessment & Plan:    1. Testosterone  deficiency  -testosterone  levels are supra therapeutic -H & H WNL -LFT's stable -decrease Clomid  50 mg, 1/2 tablet every other day  -recheck am testosterone  level in one month   2. BPH with LUTS -PSA stable -DRE benign -continue conservative management, avoiding bladder irritants and timed voiding's  3. Erectile dysfunction:    - no intercourse since 2021   No follow-ups on file.  CLOTILDA HELON RIGGERS  [facility] [address] [place], [place] [address] [phone]

## 2023-10-05 ENCOUNTER — Ambulatory Visit: Admitting: Urology

## 2023-10-05 ENCOUNTER — Encounter: Payer: Self-pay | Admitting: Urology

## 2023-10-05 VITALS — BP 146/87 | HR 86 | Ht 69.0 in | Wt 181.0 lb

## 2023-10-05 DIAGNOSIS — N138 Other obstructive and reflux uropathy: Secondary | ICD-10-CM | POA: Diagnosis not present

## 2023-10-05 DIAGNOSIS — N401 Enlarged prostate with lower urinary tract symptoms: Secondary | ICD-10-CM | POA: Diagnosis not present

## 2023-10-05 DIAGNOSIS — N529 Male erectile dysfunction, unspecified: Secondary | ICD-10-CM | POA: Diagnosis not present

## 2023-10-05 DIAGNOSIS — E349 Endocrine disorder, unspecified: Secondary | ICD-10-CM | POA: Diagnosis not present

## 2023-10-05 MED ORDER — CLOMIPHENE CITRATE 50 MG PO TABS: ORAL_TABLET | ORAL | 3 refills | Status: DC

## 2023-10-20 ENCOUNTER — Other Ambulatory Visit: Payer: Self-pay | Admitting: Urology

## 2023-10-20 DIAGNOSIS — E349 Endocrine disorder, unspecified: Secondary | ICD-10-CM

## 2023-10-25 ENCOUNTER — Telehealth: Payer: Self-pay | Admitting: Urology

## 2023-10-25 NOTE — Telephone Encounter (Signed)
 Pt called the office wanting to know why his Clomid  has not ben called into the pharmacy yet. On his chart it says that it was refused and he is wanting to know why it was refused. He had an appt with Clotilda on 9/30 and she prescribed it to him. He would like a call to let him know what is happening with that medication.

## 2023-10-26 ENCOUNTER — Other Ambulatory Visit: Payer: Self-pay

## 2023-10-26 DIAGNOSIS — E349 Endocrine disorder, unspecified: Secondary | ICD-10-CM

## 2023-10-26 MED ORDER — CLOMIPHENE CITRATE 50 MG PO TABS: ORAL_TABLET | ORAL | 3 refills | Status: AC

## 2023-10-26 NOTE — Telephone Encounter (Signed)
 Sent refill back to pharmacy. It seems to be an issue on their side. They did not let patient have original order called in on 9/30. Pt aware to give us  a call back if he continues to have issues getting med.

## 2023-10-28 ENCOUNTER — Ambulatory Visit: Payer: BC Managed Care – PPO | Admitting: Dermatology

## 2023-11-03 ENCOUNTER — Other Ambulatory Visit

## 2023-11-03 DIAGNOSIS — E349 Endocrine disorder, unspecified: Secondary | ICD-10-CM

## 2023-11-04 ENCOUNTER — Ambulatory Visit: Payer: Self-pay | Admitting: Urology

## 2023-11-04 ENCOUNTER — Other Ambulatory Visit: Payer: Self-pay | Admitting: Urology

## 2023-11-04 DIAGNOSIS — E291 Testicular hypofunction: Secondary | ICD-10-CM

## 2023-11-04 LAB — TESTOSTERONE: Testosterone: 1256 ng/dL — ABNORMAL HIGH (ref 264–916)

## 2023-11-04 MED ORDER — CLOMIPHENE CITRATE 50 MG PO TABS
ORAL_TABLET | ORAL | 3 refills | Status: AC
Start: 2023-11-04 — End: ?

## 2023-11-09 ENCOUNTER — Ambulatory Visit: Admitting: Dermatology

## 2023-12-06 ENCOUNTER — Other Ambulatory Visit: Payer: Self-pay

## 2023-12-06 ENCOUNTER — Encounter: Payer: Self-pay | Admitting: Dermatology

## 2023-12-06 ENCOUNTER — Other Ambulatory Visit

## 2023-12-06 ENCOUNTER — Ambulatory Visit: Admitting: Dermatology

## 2023-12-06 DIAGNOSIS — Z7189 Other specified counseling: Secondary | ICD-10-CM

## 2023-12-06 DIAGNOSIS — L570 Actinic keratosis: Secondary | ICD-10-CM | POA: Diagnosis not present

## 2023-12-06 DIAGNOSIS — W908XXA Exposure to other nonionizing radiation, initial encounter: Secondary | ICD-10-CM

## 2023-12-06 DIAGNOSIS — E349 Endocrine disorder, unspecified: Secondary | ICD-10-CM

## 2023-12-06 DIAGNOSIS — N138 Other obstructive and reflux uropathy: Secondary | ICD-10-CM

## 2023-12-06 DIAGNOSIS — L578 Other skin changes due to chronic exposure to nonionizing radiation: Secondary | ICD-10-CM | POA: Diagnosis not present

## 2023-12-06 NOTE — Progress Notes (Addendum)
   Follow-Up Visit   Subjective  Christopher Rose is a 63 y.o. male who presents for the following: AK's treated w/f-FU on the temples and forehead. Patient completed treatment in February.    The following portions of the chart were reviewed this encounter and updated as appropriate: medications, allergies, medical history  Review of Systems:  No other skin or systemic complaints except as noted in HPI or Assessment and Plan.  Objective  Well appearing patient in no apparent distress; mood and affect are within normal limits.   A focused examination was performed of the following areas: Face Clearance of AKs on forehead temples Improvement in but persistence of AKs on preauricular areas with erythematous scaly papules  Relevant exam findings are noted in the Assessment and Plan.    Assessment & Plan   ACTINIC DAMAGE WITH PRECANCEROUS ACTINIC KERATOSES, improved with treatment but not at goal Counseling for Topical Chemotherapy Management: Patient exhibits: - Severe, confluent actinic changes with pre-cancerous actinic keratoses that is secondary to cumulative UV radiation exposure over time - Condition that is severe; chronic, not at goal. - diffuse scaly erythematous macules and papules with underlying dyspigmentation - Discussed Prescription Field Treatment topical Chemotherapy for Severe, Chronic Confluent Actinic Changes with Pre-Cancerous Actinic Keratoses Field treatment involves treatment of an entire area of skin that has confluent Actinic Changes (Sun/ Ultraviolet light damage) and PreCancerous Actinic Keratoses by method of PhotoDynamic Therapy (PDT) and/or prescription Topical Chemotherapy agents such as 5-fluorouracil , 5-fluorouracil /calcipotriene, and/or imiquimod.  The purpose is to decrease the number of clinically evident and subclinical PreCancerous lesions to prevent progression to development of skin cancer by chemically destroying early precancer changes that may  or may not be visible.  It has been shown to reduce the risk of developing skin cancer in the treated area. As a result of treatment, redness, scaling, crusting, and open sores may occur during treatment course. One or more than one of these methods may be used and may have to be used several times to control, suppress and eliminate the PreCancerous changes. Discussed treatment course, expected reaction, and possible side effects. - Recommend daily broad spectrum sunscreen SPF 30+ to sun-exposed areas, reapply every 2 hours as needed.  - Staying in the shade or wearing long sleeves, sun glasses (UVA+UVB protection) and wide brim hats (4-inch brim around the entire circumference of the hat) are also recommended. - Call for new or changing lesions.  - Treatment completed in February with good results on the temples, forehead, and cheeks - recommended repeat 5FU BID up to 5 weeks on sideburn/preauricular areas bilaterally. Patient deferred treatment of sideburn areas until after he retires in September ACTINIC ELASTOSIS   ACTINIC KERATOSES    Return in about 1 year (around 12/05/2024) for AK follow up. Christopher Rose Emerick Nance, CMA am acting as scribe for Boneta Sharps, MD.  Documentation: I have reviewed the above documentation for accuracy and completeness, and I agree with the above.  Boneta Sharps, MD

## 2023-12-06 NOTE — Patient Instructions (Signed)

## 2023-12-07 ENCOUNTER — Other Ambulatory Visit

## 2023-12-07 DIAGNOSIS — N138 Other obstructive and reflux uropathy: Secondary | ICD-10-CM

## 2023-12-07 DIAGNOSIS — E349 Endocrine disorder, unspecified: Secondary | ICD-10-CM

## 2023-12-08 ENCOUNTER — Ambulatory Visit: Payer: Self-pay | Admitting: Urology

## 2023-12-08 LAB — TESTOSTERONE: Testosterone: 1294 ng/dL — ABNORMAL HIGH (ref 264–916)

## 2024-01-19 ENCOUNTER — Other Ambulatory Visit: Payer: Self-pay

## 2024-01-19 DIAGNOSIS — E291 Testicular hypofunction: Secondary | ICD-10-CM

## 2024-02-02 ENCOUNTER — Other Ambulatory Visit

## 2024-02-02 DIAGNOSIS — E291 Testicular hypofunction: Secondary | ICD-10-CM

## 2024-02-03 ENCOUNTER — Ambulatory Visit: Payer: Self-pay | Admitting: Urology

## 2024-02-03 LAB — TESTOSTERONE: Testosterone: 975 ng/dL — ABNORMAL HIGH (ref 264–916)

## 2024-12-05 ENCOUNTER — Encounter: Admitting: Dermatology
